# Patient Record
Sex: Female | Born: 1950 | Race: White | Hispanic: No | Marital: Married | Smoking: Never smoker
Health system: Southern US, Community
[De-identification: ages and names within clinical notes are randomized; demographics above are authoritative.]

## PROBLEM LIST (undated history)

## (undated) DIAGNOSIS — Z789 Other specified health status: Secondary | ICD-10-CM

## (undated) DIAGNOSIS — Z1371 Encounter for nonprocreative screening for genetic disease carrier status: Secondary | ICD-10-CM

## (undated) HISTORY — PX: EYE SURGERY: SHX253

## (undated) HISTORY — PX: OTHER SURGICAL HISTORY: SHX169

## (undated) HISTORY — DX: Encounter for nonprocreative screening for genetic disease carrier status: Z13.71

## (undated) HISTORY — PX: APPENDECTOMY: SHX54

---

## 1979-05-18 DIAGNOSIS — O321XX0 Maternal care for breech presentation, not applicable or unspecified: Secondary | ICD-10-CM

## 1989-04-28 HISTORY — PX: TUBAL LIGATION: SHX77

## 2004-07-16 ENCOUNTER — Ambulatory Visit: Payer: Self-pay | Admitting: Unknown Physician Specialty

## 2005-08-13 ENCOUNTER — Ambulatory Visit: Payer: Self-pay | Admitting: Unknown Physician Specialty

## 2006-08-20 ENCOUNTER — Ambulatory Visit: Payer: Self-pay | Admitting: Unknown Physician Specialty

## 2007-08-24 ENCOUNTER — Ambulatory Visit: Payer: Self-pay | Admitting: Unknown Physician Specialty

## 2008-05-24 ENCOUNTER — Ambulatory Visit: Payer: Self-pay | Admitting: Unknown Physician Specialty

## 2008-08-29 ENCOUNTER — Ambulatory Visit: Payer: Self-pay | Admitting: Unknown Physician Specialty

## 2009-03-18 ENCOUNTER — Ambulatory Visit: Payer: Self-pay | Admitting: Specialist

## 2009-04-16 ENCOUNTER — Encounter: Payer: Self-pay | Admitting: Specialist

## 2009-04-28 ENCOUNTER — Encounter: Payer: Self-pay | Admitting: Specialist

## 2009-04-28 HISTORY — PX: CARPAL TUNNEL RELEASE: SHX101

## 2009-05-29 ENCOUNTER — Encounter: Payer: Self-pay | Admitting: Specialist

## 2009-06-26 ENCOUNTER — Encounter: Payer: Self-pay | Admitting: Specialist

## 2009-08-30 ENCOUNTER — Ambulatory Visit: Payer: Self-pay | Admitting: Unknown Physician Specialty

## 2010-02-06 ENCOUNTER — Ambulatory Visit: Payer: Self-pay | Admitting: Specialist

## 2010-11-11 ENCOUNTER — Ambulatory Visit: Payer: Self-pay

## 2012-01-12 ENCOUNTER — Ambulatory Visit: Payer: Self-pay | Admitting: Gastroenterology

## 2012-01-13 LAB — PATHOLOGY REPORT

## 2012-05-26 ENCOUNTER — Ambulatory Visit: Payer: Self-pay | Admitting: Ophthalmology

## 2013-03-29 ENCOUNTER — Ambulatory Visit: Payer: Self-pay | Admitting: Ophthalmology

## 2013-08-06 DIAGNOSIS — N951 Menopausal and female climacteric states: Secondary | ICD-10-CM | POA: Insufficient documentation

## 2013-08-06 DIAGNOSIS — E785 Hyperlipidemia, unspecified: Secondary | ICD-10-CM | POA: Insufficient documentation

## 2013-08-06 DIAGNOSIS — J309 Allergic rhinitis, unspecified: Secondary | ICD-10-CM | POA: Insufficient documentation

## 2014-08-18 NOTE — Op Note (Signed)
PATIENT NAMMarva Panda:  [name], Elizabeth [name] MR#:  [id number] DATE OF BIRTH:  Jul 13, 1950  DATE OF PROCEDURE:  05/26/2012  PROCEDURES PERFORMED:  1.  Pars plana vitrectomy of the left eye.  2.  Internal limiting membrane peel of the left eye.  3.  Gas exchange of the left eye.   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   PRIMARY SURGEON:  Aron BabaMatthew Bruk Tumolo, M.D.  ESTIMATED BLOOD LOSS: Less than 1 mL.   ANESTHESIA: Retrobulbar block of the left eye with monitored anesthesia care.   COMPLICATIONS: None.   INDICATIONS FOR PROCEDURE: This patient presented to my office with decreased vision in the left eye. Examination revealed a full a full thickness macular hole of the left eye. Risks, benefits, and alternatives of the above procedure were discussed and the patient wished to proceed.   DETAILS OF PROCEDURE: After informed consent was obtained, the patient to the operative suite at [facility]. The patient was placed in supine position and was given a small dose of Alfenta and a retrobulbar block was performed on the left eye by the primary surgeon without any complications. The left eye was prepped and draped in sterile manner. After lid speculum was inserted, a 25-gauge trocar was placed inferotemporally through displaced conjunctiva in an oblique fashion 4 mm beyond the limbus. The infusion cannula was turned on and inserted through the trocar and secured into position with Steri-Strips. Two more trocars were placed in a similar fashion superotemporally and superonasally. Vitreous cutter and light pipe were introduced in the eye and a core vitrectomy was performed. Vitreous face was elevated off of the optic nerve using suction. Peripheral vitreous was trimmed for 360 degrees. Indocyanine green was injected into the posterior pole and removed within 30 seconds. Internal limiting membrane  was incised and peeled for 360 degrees around the fovea for a total diameter of 2 disk diameters. Scleral depressed exam was performed for 360 degrees. No signs of any breaks, tears or retinal detachment could be identified. Air-fluid exchange was performed. Four minutes was allowed to elapse for dehydration of the vitreous base. This remnant fluid was removed. 22% SF6 was used as an Nurse, children'sair gas exchange. Trocars were removed and the wounds were noted to be airtight. Pressure in the eye was confirmed to be approximately 15 mmHg. 5 mg of dexamethasone was given into the inferior fornix. The lid speculum was removed. The eye was cleaned and TobraDex was placed in the eye. A patch and shield were placed over the eye. The patient was taken to postanesthesia care with instructions to remain face down.    ____________________________ Ignacia FellingMatthew F. Champ MungoAppenzeller, MD mfa:aw D: 05/26/2012 11:10:07 ET T: 05/26/2012 11:18:27 ET JOB#: [id number]  cc: Ignacia FellingMatthew F. Champ MungoAppenzeller, MD, <Dictator> Cline CoolsMATTHEW F Deloris Moger MD ELECTRONICALLY SIGNED 06/22/2012 6:46

## 2014-08-18 NOTE — Op Note (Signed)
PATIENT NAME:  Elizabeth [name], [name] MR#:  [id number] DATE OF BIRTH:  1951-04-04  DATE OF PROCEDURE:  03/29/2013  PREOPERATIVE DIAGNOSIS: Visually significant cataract of the left eye.   POSTOPERATIVE DIAGNOSIS: Visually significant cataract of the left eye.   OPERATIVE PROCEDURE: Cataract extraction by phacoemulsification with implant of intraocular lens to left eye.   SURGEON: Galen ManilaWilliam Felesha Moncrieffe, MD.   ANESTHESIA:  1. Managed anesthesia care.  2. Topical tetracaine drops followed by 2% Xylocaine jelly applied in the preoperative holding area.   COMPLICATIONS: None.   TECHNIQUE:  Stop and chop.  DESCRIPTION OF PROCEDURE: The patient was examined and consented in the preoperative holding area where the aforementioned topical anesthesia was applied to the left eye and then brought back to the Operating Room where the left eye was prepped and draped in the usual sterile ophthalmic fashion and a lid speculum was placed. A paracentesis was created with the side port blade and the anterior chamber was filled with viscoelastic. A near clear corneal incision was performed with the steel keratome. A continuous curvilinear capsulorrhexis was performed with a cystotome followed by the capsulorrhexis forceps. Hydrodissection and hydrodelineation were carried out with BSS on a blunt cannula. The lens was removed in a stop and chop technique and the remaining cortical material was removed with the irrigation-aspiration handpiece. The capsular bag was inflated with viscoelastic and the Tecnis ZCB00 21.5-diopter lens, serial number [id number] was placed in the capsular bag without complication. The remaining viscoelastic was removed from the eye with the irrigation-aspiration handpiece. The wounds were hydrated. The anterior chamber was flushed with Miostat and the eye was inflated to physiologic pressure. 0.1 mL of cefuroxime concentration 10 mg/mL was placed in the anterior chamber. The wounds were found to be water  tight. The eye was dressed with Vigamox. The patient was given protective glasses to wear throughout the day and a shield with which to sleep tonight. The patient was also given drops with which to begin a drop regimen today and will follow-up with me in one day.     ____________________________ Jerilee FieldWilliam L. Westly Hinnant, MD wlp:dmm D: 03/29/2013 21:28:00 ET T: 03/29/2013 21:40:45 ET JOB#: [id number]  cc: Giancarlo Askren L. Florentine Diekman, MD, <Dictator> Jerilee FieldWILLIAM L Dawt Reeb MD ELECTRONICALLY SIGNED 03/31/2013 9:56

## 2014-11-30 LAB — HM MAMMOGRAPHY
HM MAMMO: NORMAL (ref 0–4)
HM Mammogram: NORMAL (ref 0–4)

## 2014-11-30 LAB — HM PAP SMEAR: HM Pap smear: NEGATIVE

## 2015-04-29 DIAGNOSIS — Z1371 Encounter for nonprocreative screening for genetic disease carrier status: Secondary | ICD-10-CM

## 2015-04-29 HISTORY — DX: Encounter for nonprocreative screening for genetic disease carrier status: Z13.71

## 2016-09-17 DIAGNOSIS — H2511 Age-related nuclear cataract, right eye: Secondary | ICD-10-CM | POA: Diagnosis not present

## 2016-11-05 DIAGNOSIS — H2511 Age-related nuclear cataract, right eye: Secondary | ICD-10-CM | POA: Diagnosis not present

## 2016-11-06 ENCOUNTER — Encounter: Payer: Self-pay | Admitting: *Deleted

## 2016-11-07 LAB — HM MAMMOGRAPHY: HM Mammogram: NORMAL (ref 0–4)

## 2016-11-10 ENCOUNTER — Telehealth: Payer: Self-pay | Admitting: Obstetrics and Gynecology

## 2016-11-10 NOTE — Telephone Encounter (Signed)
AMS pt. 

## 2016-11-10 NOTE — Telephone Encounter (Signed)
AMS it looks like it was refilled by an outside provider on 7/12. Please look behind me and advise. Thanks

## 2016-11-10 NOTE — Telephone Encounter (Signed)
Pt is scheduled for her annual on 01/21/17 but is about to run out of her progesterone testosterone cream and would like a refill sent to [facility]. Please advise. Thanks TNP

## 2016-11-12 ENCOUNTER — Other Ambulatory Visit: Payer: Self-pay | Admitting: Obstetrics and Gynecology

## 2016-11-12 MED ORDER — UNABLE TO FIND: 0.5000 mL | Freq: Every day | 6 refills | Status: DC

## 2016-11-12 NOTE — Telephone Encounter (Signed)
Left her a voicemail that it was sent this morning (that is the RX I could escribe)

## 2016-11-12 NOTE — Telephone Encounter (Signed)
Patient calling about Rx this morning, has checked with [facility] and they haven't received anything.  She says this is a compound med and her insurance doesn't cover and she pays out of pocket.  Pt is about to run out, appt in September.

## 2016-11-18 ENCOUNTER — Ambulatory Visit
Admission: RE | Admit: 2016-11-18 | Discharge: 2016-11-18 | Disposition: A | Discharge status: Home / Self Care | Payer: BLUE CROSS/BLUE SHIELD | Source: Ambulatory Visit | Attending: Ophthalmology | Admitting: Ophthalmology

## 2016-11-18 ENCOUNTER — Encounter
Admission: RE | Disposition: A | Discharge status: Home / Self Care | Payer: Self-pay | Source: Ambulatory Visit | Attending: Ophthalmology

## 2016-11-18 ENCOUNTER — Ambulatory Visit: Payer: BLUE CROSS/BLUE SHIELD | Admitting: Anesthesiology

## 2016-11-18 ENCOUNTER — Encounter: Payer: Self-pay | Admitting: *Deleted

## 2016-11-18 DIAGNOSIS — Z79899 Other long term (current) drug therapy: Secondary | ICD-10-CM | POA: Insufficient documentation

## 2016-11-18 DIAGNOSIS — Z7952 Long term (current) use of systemic steroids: Secondary | ICD-10-CM | POA: Diagnosis not present

## 2016-11-18 DIAGNOSIS — H2511 Age-related nuclear cataract, right eye: Secondary | ICD-10-CM | POA: Insufficient documentation

## 2016-11-18 HISTORY — PX: CATARACT EXTRACTION W/PHACO: SHX586

## 2016-11-18 HISTORY — DX: Other specified health status: Z78.9

## 2016-11-18 SURGERY — PHACOEMULSIFICATION, CATARACT, WITH IOL INSERTION
Anesthesia: Monitor Anesthesia Care | Site: Eye | Laterality: Right | Wound class: Clean

## 2016-11-18 MED ORDER — NA CHONDROIT SULF-NA HYALURON 40-17 MG/ML IO SOLN
INTRAOCULAR | Status: DC | PRN
  Administered 2016-11-18: 1 mL via INTRAOCULAR

## 2016-11-18 MED ORDER — MIDAZOLAM HCL 2 MG/2ML IJ SOLN
INTRAMUSCULAR | Status: AC
  Filled 2016-11-18: qty 2

## 2016-11-18 MED ORDER — FENTANYL CITRATE (PF) 100 MCG/2ML IJ SOLN
INTRAMUSCULAR | Status: AC
  Filled 2016-11-18: qty 2

## 2016-11-18 MED ORDER — EPINEPHRINE PF 1 MG/ML IJ SOLN
INTRAOCULAR | Status: DC | PRN
  Administered 2016-11-18: 10:00:00 via OPHTHALMIC

## 2016-11-18 MED ORDER — MOXIFLOXACIN HCL 0.5 % OP SOLN: 1.0000 [drp] | OPHTHALMIC | Status: DC | PRN

## 2016-11-18 MED ORDER — SODIUM CHLORIDE 0.9 % IV SOLN
INTRAVENOUS | Status: DC
  Administered 2016-11-18: 09:00:00 via INTRAVENOUS

## 2016-11-18 MED ORDER — MOXIFLOXACIN HCL 0.5 % OP SOLN
OPHTHALMIC | Status: DC | PRN
  Administered 2016-11-18: 0.2 mL via OPHTHALMIC

## 2016-11-18 MED ORDER — MIDAZOLAM HCL 2 MG/2ML IJ SOLN
INTRAMUSCULAR | Status: DC | PRN
  Administered 2016-11-18: 1 mg via INTRAVENOUS

## 2016-11-18 MED ORDER — FENTANYL CITRATE (PF) 100 MCG/2ML IJ SOLN
INTRAMUSCULAR | Status: DC | PRN
  Administered 2016-11-18: 50 ug via INTRAVENOUS

## 2016-11-18 MED ORDER — POVIDONE-IODINE 5 % OP SOLN
OPHTHALMIC | Status: DC | PRN
  Administered 2016-11-18: 1 via OPHTHALMIC

## 2016-11-18 MED ORDER — LIDOCAINE HCL (PF) 4 % IJ SOLN
INTRAOCULAR | Status: DC | PRN
  Administered 2016-11-18: 4 mL via OPHTHALMIC

## 2016-11-18 MED ORDER — CARBACHOL 0.01 % IO SOLN
INTRAOCULAR | Status: DC | PRN
  Administered 2016-11-18: 0.5 mL via INTRAOCULAR

## 2016-11-18 MED ORDER — ARMC OPHTHALMIC DILATING DROPS
1.0000 "application " | OPHTHALMIC | Status: DC | PRN
  Administered 2016-11-18 (×3): 1 via OPHTHALMIC

## 2016-11-18 SURGICAL SUPPLY — 16 items

## 2016-11-18 NOTE — H&P (Signed)
All labs reviewed. Abnormal studies sent to patients PCP when indicated.  Previous H&P reviewed, patient examined, there are NO CHANGES.  Elizabeth [name] LOUIS7/24/20189:44 AM

## 2016-11-18 NOTE — Anesthesia Postprocedure Evaluation (Signed)
Anesthesia Post Note  Patient: Elizabeth [name]  Procedure(s) Performed: Procedure(s) (LRB): CATARACT EXTRACTION PHACO AND INTRAOCULAR LENS PLACEMENT (IOC) (Right)  Patient location during evaluation: PACU Anesthesia Type: MAC Level of consciousness: awake and alert Pain management: pain level controlled Vital Signs Assessment: post-procedure vital signs reviewed and stable Respiratory status: spontaneous breathing, nonlabored ventilation, respiratory function stable and patient connected to nasal cannula oxygen Cardiovascular status: stable and blood pressure returned to baseline Anesthetic complications: no     Last Vitals:  Vitals:   11/18/16 0840 11/18/16 1013  BP: (!) 101/53 (!) 94/40  Pulse: 63 (!) 52  Resp: 16 16  Temp: 36.6 C     Last Pain:  Vitals:   11/18/16 1013  TempSrc: Temporal                 Darlyne Russian

## 2016-11-18 NOTE — Anesthesia Post-op Follow-up Note (Cosign Needed)
Anesthesia QCDR form completed.        

## 2016-11-18 NOTE — Op Note (Signed)
.   PREOPERATIVE DIAGNOSIS:  Nuclear sclerotic cataract of the right eye.   POSTOPERATIVE DIAGNOSIS:  NUCLEAR SCLEROTIC CATARACT RIGHT EYE   OPERATIVE PROCEDURE: Procedure(s): CATARACT EXTRACTION PHACO AND INTRAOCULAR LENS PLACEMENT (IOC)   SURGEON:  Galen ManilaWilliam Martika Egler, MD.   ANESTHESIA:  Anesthesiologist: Alver FisherPenwarden, Amy, MD CRNA: Marlana SalvageJessup, Sandra, CRNA  1.      Managed anesthesia care. 2.      0.591ml of Shugarcaine was instilled in the eye following the paracentesis.   COMPLICATIONS:  None.   TECHNIQUE:   Stop and chop   DESCRIPTION OF PROCEDURE:  The patient was examined and consented in the preoperative holding area where the aforementioned topical anesthesia was applied to the right eye and then brought back to the Operating Room where the right eye was prepped and draped in the usual sterile ophthalmic fashion and a lid speculum was placed. A paracentesis was created with the side port blade and the anterior chamber was filled with viscoelastic. A near clear corneal incision was performed with the steel keratome. A continuous curvilinear capsulorrhexis was performed with a cystotome followed by the capsulorrhexis forceps. Hydrodissection and hydrodelineation were carried out with BSS on a blunt cannula. The lens was removed in a stop and chop  technique and the remaining cortical material was removed with the irrigation-aspiration handpiece. The capsular bag was inflated with viscoelastic and the Technis ZCB00  lens was placed in the capsular bag without complication. The remaining viscoelastic was removed from the eye with the irrigation-aspiration handpiece. The wounds were hydrated. The anterior chamber was flushed with Miostat and the eye was inflated to physiologic pressure. 0.71ml of Vigamox was placed in the anterior chamber. The wounds were found to be water tight. The eye was dressed with Vigamox. The patient was given protective glasses to wear throughout the day and a shield with which  to sleep tonight. The patient was also given drops with which to begin a drop regimen today and will follow-up with me in one day.  Implant Name Type Inv. Item Serial No. Manufacturer Lot No. LRB No. Used  LENS IOL DIOP 21.5 - [id number] Intraocular Lens LENS IOL DIOP 21.5 [id number]   Right 1   Procedure(s) with comments: CATARACT EXTRACTION PHACO AND INTRAOCULAR LENS PLACEMENT (IOC) (Right) - US 00:30.3 AP% 19.8 CDE 6.01 Fluid Pack lot # [id number]  Electronically signed: Moiz Ryant LOUIS 11/18/2016 10:11 AM

## 2016-11-18 NOTE — Anesthesia Preprocedure Evaluation (Signed)
Anesthesia Evaluation  Patient identified by MRN, date of birth, ID band Patient awake    Reviewed: Allergy & Precautions, NPO status , Patient's Chart, lab work & pertinent test results  History of Anesthesia Complications Negative for: history of anesthetic complications  Airway Mallampati: II  TM Distance: >3 FB Neck ROM: Full    Dental  (+) Implants, Caps   Pulmonary neg pulmonary ROS, neg sleep apnea, neg COPD,    breath sounds clear to auscultation- rhonchi (-) wheezing      Cardiovascular Exercise Tolerance: Good (-) hypertension(-) CAD, (-) Past MI and (-) Cardiac Stents  Rhythm:Regular Rate:Normal - Systolic murmurs and - Diastolic murmurs    Neuro/Psych negative neurological ROS  negative psych ROS   GI/Hepatic negative GI ROS, Neg liver ROS,   Endo/Other  negative endocrine ROSneg diabetes  Renal/GU negative Renal ROS     Musculoskeletal negative musculoskeletal ROS (+)   Abdominal (+) - obese,   Peds  Hematology negative hematology ROS (+)   Anesthesia Other Findings    Reproductive/Obstetrics                             Anesthesia Physical Anesthesia Plan  ASA: I  Anesthesia Plan: MAC   Post-op Pain Management:    Induction: Intravenous  PONV Risk Score and Plan: 2 and Midazolam  Airway Management Planned: Natural Airway  Additional Equipment:   Intra-op Plan:   Post-operative Plan:   Informed Consent: I have reviewed the patients History and Physical, chart, labs and discussed the procedure including the risks, benefits and alternatives for the proposed anesthesia with the patient or authorized representative who has indicated his/her understanding and acceptance.     Plan Discussed with: CRNA and Anesthesiologist  Anesthesia Plan Comments:         Anesthesia Quick Evaluation

## 2016-11-18 NOTE — Discharge Instructions (Signed)
Eye Surgery Discharge Instructions  Expect mild scratchy sensation or mild soreness. DO NOT RUB YOUR EYE!  The day of surgery:  Minimal physical activity, but bed rest is not required  No reading, computer work, or close hand work  No bending, lifting, or straining.  May watch TV  For 24 hours:  No driving, legal decisions, or alcoholic beverages  Safety precautions  Eat anything you prefer: It is better to start with liquids, then soup then solid foods.  _____ Eye patch should be worn until postoperative exam tomorrow.  ____ Solar shield eyeglasses should be worn for comfort in the sunlight/patch while sleeping  Resume all regular medications including aspirin or Coumadin if these were discontinued prior to surgery. You may shower, bathe, shave, or wash your hair. Tylenol may be taken for mild discomfort.  Call your doctor if you experience significant pain, nausea, or vomiting, fever > 101 or other signs of infection. [phone] or [phone] Specific instructions:  Follow-up Information    Galen ManilaPorfilio, William, MD Follow up.   Specialty:  Ophthalmology Why:  July 25 at 8:15am Contact information: [address] [place] [address] [phone]

## 2016-11-18 NOTE — Anesthesia Procedure Notes (Signed)
Procedure Name: MAC Date/Time: 11/18/2016 9:50 AM Performed by: Darlyne Russian Pre-anesthesia Checklist: Patient identified, Emergency Drugs available, Suction available, Patient being monitored and Timeout performed Oxygen Delivery Method: Nasal cannula Placement Confirmation: positive ETCO2

## 2016-11-18 NOTE — Transfer of Care (Signed)
Immediate Anesthesia Transfer of Care Note  Patient: Elizabeth [name]  Procedure(s) Performed: Procedure(s) with comments: CATARACT EXTRACTION PHACO AND INTRAOCULAR LENS PLACEMENT (IOC) (Right) - [place] 00:30.3 AP% 19.8 CDE 6.01 Fluid Pack lot # [id number]  Patient Location: PACU  Anesthesia Type:MAC  Level of Consciousness: awake, alert  and oriented  Airway & Oxygen Therapy: Patient Spontanous Breathing  Post-op Assessment: Report given to RN and Post -op Vital signs reviewed and stable  Post vital signs: Reviewed and stable  Last Vitals:  Vitals:   11/18/16 0840 11/18/16 1013  BP: (!) 101/53 (!) 94/40  Pulse: 63 (!) 52  Resp: 16 16  Temp: 36.6 C     Last Pain:  Vitals:   11/18/16 1013  TempSrc: Temporal      Patients Stated Pain Goal: 0 (03/55/97 4163)  Complications: No apparent anesthesia complications

## 2016-12-06 DIAGNOSIS — M9905 Segmental and somatic dysfunction of pelvic region: Secondary | ICD-10-CM | POA: Diagnosis not present

## 2016-12-06 DIAGNOSIS — M5416 Radiculopathy, lumbar region: Secondary | ICD-10-CM | POA: Diagnosis not present

## 2016-12-06 DIAGNOSIS — M5431 Sciatica, right side: Secondary | ICD-10-CM | POA: Diagnosis not present

## 2016-12-06 DIAGNOSIS — M9903 Segmental and somatic dysfunction of lumbar region: Secondary | ICD-10-CM | POA: Diagnosis not present

## 2016-12-08 DIAGNOSIS — M9903 Segmental and somatic dysfunction of lumbar region: Secondary | ICD-10-CM | POA: Diagnosis not present

## 2016-12-08 DIAGNOSIS — M5416 Radiculopathy, lumbar region: Secondary | ICD-10-CM | POA: Diagnosis not present

## 2016-12-08 DIAGNOSIS — M5431 Sciatica, right side: Secondary | ICD-10-CM | POA: Diagnosis not present

## 2016-12-08 DIAGNOSIS — M9905 Segmental and somatic dysfunction of pelvic region: Secondary | ICD-10-CM | POA: Diagnosis not present

## 2016-12-17 DIAGNOSIS — M5431 Sciatica, right side: Secondary | ICD-10-CM | POA: Diagnosis not present

## 2016-12-17 DIAGNOSIS — M9903 Segmental and somatic dysfunction of lumbar region: Secondary | ICD-10-CM | POA: Diagnosis not present

## 2016-12-17 DIAGNOSIS — M9905 Segmental and somatic dysfunction of pelvic region: Secondary | ICD-10-CM | POA: Diagnosis not present

## 2016-12-17 DIAGNOSIS — M5416 Radiculopathy, lumbar region: Secondary | ICD-10-CM | POA: Diagnosis not present

## 2016-12-20 DIAGNOSIS — M5431 Sciatica, right side: Secondary | ICD-10-CM | POA: Diagnosis not present

## 2016-12-20 DIAGNOSIS — M5416 Radiculopathy, lumbar region: Secondary | ICD-10-CM | POA: Diagnosis not present

## 2016-12-20 DIAGNOSIS — M9903 Segmental and somatic dysfunction of lumbar region: Secondary | ICD-10-CM | POA: Diagnosis not present

## 2016-12-20 DIAGNOSIS — M9905 Segmental and somatic dysfunction of pelvic region: Secondary | ICD-10-CM | POA: Diagnosis not present

## 2016-12-22 DIAGNOSIS — M5416 Radiculopathy, lumbar region: Secondary | ICD-10-CM | POA: Diagnosis not present

## 2016-12-22 DIAGNOSIS — M9905 Segmental and somatic dysfunction of pelvic region: Secondary | ICD-10-CM | POA: Diagnosis not present

## 2016-12-22 DIAGNOSIS — M5431 Sciatica, right side: Secondary | ICD-10-CM | POA: Diagnosis not present

## 2016-12-22 DIAGNOSIS — M9903 Segmental and somatic dysfunction of lumbar region: Secondary | ICD-10-CM | POA: Diagnosis not present

## 2016-12-24 DIAGNOSIS — M5431 Sciatica, right side: Secondary | ICD-10-CM | POA: Diagnosis not present

## 2016-12-24 DIAGNOSIS — M9903 Segmental and somatic dysfunction of lumbar region: Secondary | ICD-10-CM | POA: Diagnosis not present

## 2016-12-24 DIAGNOSIS — M5416 Radiculopathy, lumbar region: Secondary | ICD-10-CM | POA: Diagnosis not present

## 2016-12-24 DIAGNOSIS — M9905 Segmental and somatic dysfunction of pelvic region: Secondary | ICD-10-CM | POA: Diagnosis not present

## 2017-01-21 ENCOUNTER — Encounter: Payer: Self-pay | Admitting: Obstetrics and Gynecology

## 2017-01-21 ENCOUNTER — Ambulatory Visit (INDEPENDENT_AMBULATORY_CARE_PROVIDER_SITE_OTHER): Payer: BLUE CROSS/BLUE SHIELD | Admitting: Obstetrics and Gynecology

## 2017-01-21 VITALS — BP 100/66 | HR 74 | Ht 63.0 in | Wt 117.0 lb

## 2017-01-21 DIAGNOSIS — Z01419 Encounter for gynecological examination (general) (routine) without abnormal findings: Secondary | ICD-10-CM | POA: Diagnosis not present

## 2017-01-21 DIAGNOSIS — Z124 Encounter for screening for malignant neoplasm of cervix: Secondary | ICD-10-CM | POA: Diagnosis not present

## 2017-01-21 DIAGNOSIS — Z9189 Other specified personal risk factors, not elsewhere classified: Secondary | ICD-10-CM

## 2017-01-21 DIAGNOSIS — E559 Vitamin D deficiency, unspecified: Secondary | ICD-10-CM

## 2017-01-21 DIAGNOSIS — Z1239 Encounter for other screening for malignant neoplasm of breast: Secondary | ICD-10-CM

## 2017-01-21 DIAGNOSIS — Z13220 Encounter for screening for lipoid disorders: Secondary | ICD-10-CM

## 2017-01-21 DIAGNOSIS — Z13820 Encounter for screening for osteoporosis: Secondary | ICD-10-CM | POA: Diagnosis not present

## 2017-01-21 DIAGNOSIS — M810 Age-related osteoporosis without current pathological fracture: Secondary | ICD-10-CM | POA: Diagnosis not present

## 2017-01-21 DIAGNOSIS — Z780 Asymptomatic menopausal state: Secondary | ICD-10-CM

## 2017-01-21 DIAGNOSIS — Z131 Encounter for screening for diabetes mellitus: Secondary | ICD-10-CM | POA: Diagnosis not present

## 2017-01-21 DIAGNOSIS — Z1231 Encounter for screening mammogram for malignant neoplasm of breast: Secondary | ICD-10-CM | POA: Diagnosis not present

## 2017-01-21 MED ORDER — AMBULATORY NON FORMULARY MEDICATION: 0 refills | Status: DC

## 2017-01-21 MED ORDER — ESTRADIOL 0.025 MG/24HR TD PTWK: 0.0250 mg | MEDICATED_PATCH | TRANSDERMAL | 11 refills | Status: DC

## 2017-01-21 MED ORDER — ESTRADIOL 0.025 MG/24HR TD PTWK: 0.0250 mg | MEDICATED_PATCH | TRANSDERMAL | 3 refills | Status: DC

## 2017-01-21 NOTE — Progress Notes (Signed)
Patient ID: Elizabeth [name], female   DOB: [birth year], 66 y.o.   MRN: [id number]     Gynecology Annual Exam  PCP: Kirk Ruths, MD  Chief Complaint:  Chief Complaint  Patient presents with  . Gynecologic Exam    History of Present Illness:Patient is a 66 y.o. [id number] presents for annual exam. The patient has no complaints today.   LMP: No LMP recorded. Patient is postmenopausal.  The patient is sexually active. She denies dyspareunia.  The patient does perform self breast exams.  There is no notable family history of breast or ovarian cancer in her family.  The patient wears seatbelts: yes.   The patient has regular exercise: not asked.    The patient denies current symptoms of depression.     Review of Systems: ROS  Past Medical History:  Past Medical History:  Diagnosis Date  . BRCA gene mutation negative 2017  . Medical history non-contributory     Past Surgical History:  Past Surgical History:  Procedure Laterality Date  . APPENDECTOMY    . CARPAL TUNNEL RELEASE  2011  . CATARACT EXTRACTION W/PHACO Right 11/18/2016   Procedure: CATARACT EXTRACTION PHACO AND INTRAOCULAR LENS PLACEMENT (IOC);  Surgeon: Birder Robson, MD;  Location: [facility] ORS;  Service: Ophthalmology;  Laterality: Right;  [place] 00:30.3 AP% 19.8 CDE 6.01 Fluid Pack lot # [id number]  . [place]  . ctr    . EYE SURGERY    . TUBAL LIGATION  1991    Gynecologic History:  No LMP recorded. Patient is postmenopausal. Last Pap: Results were: 2016  no abnormalities  Last mammogram: 2016 Results were: BI-RAD I Obstetric History: [id number]  Family History:  Family History  Problem Relation Age of Onset  . Heart failure Maternal Grandfather   . Heart failure Paternal Grandmother   . Breast cancer Paternal Aunt 60    Social History:  Social History   Social History  . Marital status: Married    Spouse name: N/A  . Number of children: N/A  . Years of education: N/A    Occupational History  . Not on file.   Social History Main Topics  . Smoking status: Never Smoker  . Smokeless tobacco: Current User  . Alcohol use No  . Drug use: No  . Sexual activity: Yes    Partners: Male    Birth control/ protection: Post-menopausal   Other Topics Concern  . Not on file   Social History Narrative  . No narrative on file    Allergies:  No Known Allergies  Medications: Prior to Admission medications   Medication Sig Start Date End Date Taking? Authorizing Provider  ESTROGENS CONJUGATED PO Take 0.25 mg by mouth once a week.   Yes [provider]  Fluticasone Propionate (FLONASE NA) Place into the nose.   Yes [provider]  Lifitegrast Shirley Friar OP) Apply to eye.   Yes [provider]  UNABLE TO FIND Apply 0.5 mLs topically daily. PG 40 ml/ml plus TESTOSTERONE 1/mg/ml    1/2 ml daily 11/12/16  Yes Malachy Mood, MD  XIIDRA 5 % SOLN  11/24/16  Yes [provider]    Physical Exam Vitals: Blood pressure 100/66, pulse 74, height 5' 3"  (1.6 m), weight 117 lb (53.1 kg).  General: NAD HEENT: normocephalic, anicteric Thyroid: no enlargement, no palpable nodules Pulmonary: No increased work of breathing, CTAB Cardiovascular: RRR, distal pulses 2+ Breast: Breast symmetrical, no tenderness, no palpable nodules or masses, no skin or  nipple retraction present, no nipple discharge.  No axillary or supraclavicular lymphadenopathy. Abdomen: NABS, soft, non-tender, non-distended.  Umbilicus without lesions.  No hepatomegaly, splenomegaly or masses palpable. No evidence of hernia  Genitourinary:  External: Normal external female genitalia.  Normal urethral meatus, normal  Bartholin's and Skene's glands.    Vagina: Normal vaginal mucosa, no evidence of prolapse.    Cervix: Grossly normal in appearance, no bleeding  Uterus: Non-enlarged, mobile, normal contour.  No CMT  Adnexa: ovaries non-enlarged, no adnexal masses  Rectal:  deferred  Lymphatic: no evidence of inguinal lymphadenopathy Extremities: no edema, erythema, or tenderness Neurologic: Grossly intact Psychiatric: mood appropriate, affect full  Female chaperone present for pelvic and breast  portions of the physical exam     Assessment: 66 y.o. [id number] routine annual exam  Plan: Problem List Items Addressed This Visit    None    Visit Diagnoses    Osteoporosis screening    -  Primary   Relevant Orders   CMP14+LP+TP+TSH+CBC/Plt   DG Bone Density   Screening for malignant neoplasm of cervix       Screening for diabetes mellitus       Relevant Orders   CMP14+LP+TP+TSH+CBC/Plt   Screening for lipoid disorders       Relevant Orders   CMP14+LP+TP+TSH+CBC/Plt   Breast screening       Relevant Orders   MM DIGITAL SCREENING BILATERAL   Encounter for gynecological examination without abnormal finding       Relevant Orders   CMP14+LP+TP+TSH+CBC/Plt   DG Bone Density   Vitamin D deficiency       High risk for hip fracture       Relevant Orders   DG Bone Density   Osteopenia after menopause       Relevant Orders   DG Bone Density      1) Mammogram - recommend yearly screening mammogram.  Mammogram Was ordered today  2) STI screening was not offered  3) ASCCP guidelines and rational discussed.  Patient opts for discontinue screening interval  4) Osteoporosis  - per USPTF routine screening DEXA at age 75 - FRAX 66 year [name] fracture risk 19%, 10 year hip fracture risk 5.0 taking account prior fracture  Consider FDA-approved medical therapies in postmenopausal women and men aged 66 years and older, based on the following: a) A hip or vertebral (clinical or morphometric) fracture b) T-score ? -2.5 at the femoral neck or spine after appropriate evaluation to exclude secondary causes C) Low bone mass (T-score between -1.0 and -2.5 at the femoral neck or spine) and a 10-year probability of a hip fracture ? 3% or a 10-year probability of a  [name] osteoporosis-related fracture ? 20% based on the [place]-adapted WHO algorithm  We discussed WHI study findings in detail.  In the combined estrogen-progesterone arm breast cancer risk was increased by 1.26 (CI of 1.00 to 1.59), coronary heart disease 1.29 (CI 1.02-1.63), stroke risk 1.41 (1.07-1.85), and pulmonary embolism 2.13 (CI 1.39-3.25).  That being said the while statistically significant the actual number of cases attributable are relatively small at an addition 8 cases of breast cancer, 7 more coronary artery event, 8 more strokes, and 8 additional case of pulmonary embolism per 10,000 women.  Study was terminated because of the increased breast cancer risk, this was not seen in the progestin only arm of the study for women without an intact uterus.  In addition it is important to note that HRT also had positive or risk reducing  effects, and all cause mortality between the HRT/non-HRT users is not statistically different.  Estrogen-progestin HRT decreased the relative risk of hip fracture 0.66 (CI 0.45-0.98), colorectal cancer 0.63 (0.43-0.92).  Current consensus is to limit dose to the lowest effective dose, and shortest treatment duration possible.  Breast cancer risk appeared to increase after 4 years of use.  Also important to note is that these risk refer to systemic HRT for the treatment of vasomotor symptoms, and do not apply to vaginal preperations with minimal systemic absorption and aimed at treating symptoms of vulvovaginal atrophy.    We briefly touched on findings of WHIMS trial published in 2005 which looked at women 26 year of age or older, and whether HRT was protective against the development of dementia.  The study revealed that HRT actually increased the risk for the development of dementia but was limited by looking only at patients 58 years of age and older.  The subsequent KEEPS trial  In 2012 which looked at HRT in recently postmenopausal women did not show any improvement in  cognitive function for women on HRT.  However, there was also no significant cognitive declines seen in recently postmenopausal women receiving HRT as had previously been shown in the [facility] trial.    5) Routine healthcare maintenance including cholesterol, diabetes screening discussed managed by PCP  6) Colonoscopy - up to date managed by PCP  7) Follow up 1 year for routine annual

## 2017-01-21 NOTE — Patient Instructions (Addendum)
-   FRAX 10 year major fracture risk 19%, 10 year hip fracture risk 5.0 taking account prior fracture  We discussed WHI study findings in detail.  In the combined estrogen-progesterone arm breast cancer risk was increased by 1.26 (CI of 1.00 to 1.59), coronary heart disease 1.29 (CI 1.02-1.63), stroke risk 1.41 (1.07-1.85), and pulmonary embolism 2.13 (CI 1.39-3.25).  That being said the while statistically significant the actual number of cases attributable are relatively small at an addition 8 cases of breast cancer, 7 more coronary artery event, 8 more strokes, and 8 additional case of pulmonary embolism per 10,000 women.  Study was terminated because of the increased breast cancer risk, this was not seen in the progestin only arm of the study for women without an intact uterus.  In addition it is important to note that HRT also had positive or risk reducing effects, and all cause mortality between the HRT/non-HRT users is not statistically different.  Estrogen-progestin HRT decreased the relative risk of hip fracture 0.66 (CI 0.45-0.98), colorectal cancer 0.63 (0.43-0.92).  Current consensus is to limit dose to the lowest effective dose, and shortest treatment duration possible.  Breast cancer risk appeared to increase after 4 years of use.  Also important to note is that these risk refer to systemic HRT for the treatment of vasomotor symptoms, and do not apply to vaginal preperations with minimal systemic absorption and aimed at treating symptoms of vulvovaginal atrophy.    We briefly touched on findings of WHIMS trial published in 2005 which looked at women 44 year of age or older, and whether HRT was protective against the development of dementia.  The study revealed that HRT actually increased the risk for the development of dementia but was limited by looking only at patients 76 years of age and older.  The subsequent KEEPS trial  In 2012 which looked at HRT in recently postmenopausal women did not show  any improvement in cognitive function for women on HRT.  However, there was also no significant cognitive declines seen in recently postmenopausal women receiving HRT as had previously been shown in the [facility] trial.

## 2017-01-22 LAB — CMP14+LP+TP+TSH+CBC/PLT
A/G RATIO: 2.9 — AB (ref 1.2–2.2)
ALT: 10 IU/L (ref 0–32)
AST: 18 IU/L (ref 0–40)
Albumin: 4.4 g/dL (ref 3.6–4.8)
Alkaline Phosphatase: 64 IU/L (ref 39–117)
BILIRUBIN TOTAL: 0.4 mg/dL (ref 0.0–1.2)
BUN/Creatinine Ratio: 19 (ref 12–28)
BUN: 16 mg/dL (ref 8–27)
CALCIUM: 9.5 mg/dL (ref 8.7–10.3)
CO2: 26 mmol/L (ref 20–29)
Chloride: 106 mmol/L (ref 96–106)
Cholesterol, Total: 219 mg/dL — ABNORMAL HIGH (ref 100–199)
Creatinine, Ser: 0.86 mg/dL (ref 0.57–1.00)
Free Thyroxine Index: 1.7 (ref 1.2–4.9)
GFR calc Af Amer: 82 mL/min/{1.73_m2} (ref >59)
GFR, EST NON AFRICAN AMERICAN: 71 mL/min/{1.73_m2} (ref >59)
GLUCOSE: 97 mg/dL (ref 65–99)
Globulin, Total: 1.5 g/dL (ref 1.5–4.5)
HDL: 96 mg/dL (ref >39)
Hematocrit: 41 % (ref 34.0–46.6)
Hemoglobin: 13.8 g/dL (ref 11.1–15.9)
LDL Calculated: 114 mg/dL — ABNORMAL HIGH (ref 0–99)
LDl/HDL Ratio: 1.2 ratio (ref 0.0–3.2)
MCH: 29.9 pg (ref 26.6–33.0)
MCHC: 33.7 g/dL (ref 31.5–35.7)
MCV: 89 fL (ref 79–97)
PLATELETS: 200 10*3/uL (ref 150–379)
Potassium: 4.7 mmol/L (ref 3.5–5.2)
RBC: 4.62 x10E6/uL (ref 3.77–5.28)
RDW: 13.3 % (ref 12.3–15.4)
Sodium: 145 mmol/L — ABNORMAL HIGH (ref 134–144)
T3 UPTAKE RATIO: 25 % (ref 24–39)
T4, Total: 6.9 ug/dL (ref 4.5–12.0)
TRIGLYCERIDES: 46 mg/dL (ref 0–149)
TSH: 2.84 u[IU]/mL (ref 0.450–4.500)
Total Protein: 5.9 g/dL — ABNORMAL LOW (ref 6.0–8.5)
VLDL Cholesterol Cal: 9 mg/dL (ref 5–40)
WBC: 4.1 10*3/uL (ref 3.4–10.8)

## 2017-03-03 DIAGNOSIS — J3489 Other specified disorders of nose and nasal sinuses: Secondary | ICD-10-CM | POA: Diagnosis not present

## 2017-03-03 DIAGNOSIS — J029 Acute pharyngitis, unspecified: Secondary | ICD-10-CM | POA: Diagnosis not present

## 2017-03-03 DIAGNOSIS — R0981 Nasal congestion: Secondary | ICD-10-CM | POA: Diagnosis not present

## 2017-03-21 DIAGNOSIS — R05 Cough: Secondary | ICD-10-CM | POA: Diagnosis not present

## 2017-03-21 DIAGNOSIS — J069 Acute upper respiratory infection, unspecified: Secondary | ICD-10-CM | POA: Diagnosis not present

## 2017-03-21 DIAGNOSIS — J019 Acute sinusitis, unspecified: Secondary | ICD-10-CM | POA: Diagnosis not present

## 2017-05-19 ENCOUNTER — Ambulatory Visit
Admission: RE | Admit: 2017-05-19 | Discharge: 2017-05-19 | Disposition: A | Discharge status: Home / Self Care | Payer: BLUE CROSS/BLUE SHIELD | Source: Ambulatory Visit | Attending: Obstetrics and Gynecology | Admitting: Obstetrics and Gynecology

## 2017-05-19 DIAGNOSIS — Z1231 Encounter for screening mammogram for malignant neoplasm of breast: Secondary | ICD-10-CM | POA: Insufficient documentation

## 2017-05-19 DIAGNOSIS — Z780 Asymptomatic menopausal state: Secondary | ICD-10-CM | POA: Diagnosis not present

## 2017-05-19 DIAGNOSIS — Z01419 Encounter for gynecological examination (general) (routine) without abnormal findings: Secondary | ICD-10-CM | POA: Diagnosis not present

## 2017-05-19 DIAGNOSIS — Z13820 Encounter for screening for osteoporosis: Secondary | ICD-10-CM | POA: Diagnosis not present

## 2017-05-19 DIAGNOSIS — Z9189 Other specified personal risk factors, not elsewhere classified: Secondary | ICD-10-CM | POA: Insufficient documentation

## 2017-05-19 DIAGNOSIS — Z1239 Encounter for other screening for malignant neoplasm of breast: Secondary | ICD-10-CM

## 2017-05-19 DIAGNOSIS — M810 Age-related osteoporosis without current pathological fracture: Secondary | ICD-10-CM | POA: Diagnosis not present

## 2017-05-19 DIAGNOSIS — M85852 Other specified disorders of bone density and structure, left thigh: Secondary | ICD-10-CM | POA: Insufficient documentation

## 2017-05-22 ENCOUNTER — Inpatient Hospital Stay
Admission: RE | Admit: 2017-05-22 | Discharge: 2017-05-22 | Disposition: A | Discharge status: Home / Self Care | Payer: Self-pay | Source: Ambulatory Visit | Attending: *Deleted | Admitting: *Deleted

## 2017-05-22 ENCOUNTER — Other Ambulatory Visit: Payer: Self-pay | Admitting: *Deleted

## 2017-05-22 DIAGNOSIS — Z9289 Personal history of other medical treatment: Secondary | ICD-10-CM

## 2017-06-09 ENCOUNTER — Other Ambulatory Visit: Payer: Self-pay | Admitting: Obstetrics and Gynecology

## 2017-06-09 DIAGNOSIS — Z1371 Encounter for nonprocreative screening for genetic disease carrier status: Secondary | ICD-10-CM

## 2017-06-09 MED ORDER — AMBULATORY NON FORMULARY MEDICATION: 6 refills | Status: DC

## 2017-06-09 NOTE — Progress Notes (Signed)
Pharmacy change to [facility] in [place]

## 2017-06-11 ENCOUNTER — Other Ambulatory Visit: Payer: Self-pay

## 2017-06-11 MED ORDER — AMBULATORY NON FORMULARY MEDICATION: 6 refills | Status: DC

## 2017-06-25 DIAGNOSIS — H02889 Meibomian gland dysfunction of unspecified eye, unspecified eyelid: Secondary | ICD-10-CM | POA: Diagnosis not present

## 2017-07-28 ENCOUNTER — Telehealth: Payer: Self-pay

## 2017-07-28 NOTE — Telephone Encounter (Signed)
Pt called triage today wanting to know if Dr. Bonney AidStaebler could prescribe her with something because she thinks she has a yeast infection. Called pt back and scheduled her an appt for tomorrow at 8:10 with staebler

## 2017-07-29 ENCOUNTER — Encounter: Payer: Self-pay | Admitting: Obstetrics and Gynecology

## 2017-07-29 ENCOUNTER — Ambulatory Visit: Payer: BLUE CROSS/BLUE SHIELD | Admitting: Obstetrics and Gynecology

## 2017-07-29 VITALS — BP 102/54 | HR 66 | Ht 63.0 in | Wt 117.0 lb

## 2017-07-29 DIAGNOSIS — B373 Candidiasis of vulva and vagina: Secondary | ICD-10-CM

## 2017-07-29 DIAGNOSIS — Z113 Encounter for screening for infections with a predominantly sexual mode of transmission: Secondary | ICD-10-CM | POA: Diagnosis not present

## 2017-07-29 DIAGNOSIS — B3731 Acute candidiasis of vulva and vagina: Secondary | ICD-10-CM

## 2017-07-29 DIAGNOSIS — N765 Ulceration of vagina: Secondary | ICD-10-CM

## 2017-07-29 MED ORDER — AMBULATORY NON FORMULARY MEDICATION: 6 refills | Status: DC

## 2017-07-29 MED ORDER — FLUCONAZOLE 150 MG PO TABS: 150.0000 mg | ORAL_TABLET | Freq: Once | ORAL | 0 refills | Status: AC

## 2017-07-29 NOTE — Progress Notes (Signed)
Obstetrics & Gynecology Office Visit   Chief Complaint:  Chief Complaint  Patient presents with  . Vaginal irritation    Vaginal swelling/milky discharge/no odor    History of Present Illness: [name] is a 67 y.o. [id number] who LMP was No LMP recorded. Patient is postmenopausal., presents today for a problem visit.   Patient complains of an abnormal vaginal discharge for 6 days. Discharge described as: white. Vaginal symptoms include burning, local irritation and swelling.   Other associated symptoms: none.Menstrual pattern:postmenopausal.  She denies recent antibiotic exposure, admits to changes in soaps, detergents coinciding with the onset of her symptoms.  She has not previously self treated or been under treatment by another provider for these symptoms.  No history of recurrent yeast infections.  The patient is not diabetic or otherwise imunocompromised.     Review of Systems: Review of Systems  Constitutional: Negative.   Genitourinary: Positive for dysuria. Negative for frequency, hematuria and urgency.  Skin: Positive for rash.   Past Medical History:  Past Medical History:  Diagnosis Date  . BRCA gene mutation negative 2017  . Medical history non-contributory     Past Surgical History:  Past Surgical History:  Procedure Laterality Date  . APPENDECTOMY    . CARPAL TUNNEL RELEASE  2011  . CATARACT EXTRACTION W/PHACO Right 11/18/2016   Procedure: CATARACT EXTRACTION PHACO AND INTRAOCULAR LENS PLACEMENT (IOC);  Surgeon: Birder Robson, MD;  Location: [facility] ORS;  Service: Ophthalmology;  Laterality: Right;  [place] 00:30.3 AP% 19.8 CDE 6.01 Fluid Pack lot # [id number]  . [place]  . ctr    . EYE SURGERY    . TUBAL LIGATION  1991    Gynecologic History: No LMP recorded. Patient is postmenopausal.  Obstetric History: [id number]  Family History:  Family History  Problem Relation Age of Onset  . Heart failure Maternal Grandfather   . Heart  failure Paternal Grandmother   . Breast cancer Paternal Aunt 97    Social History:  Social History   Socioeconomic History  . Marital status: Married    Spouse name: Not on file  . Number of children: Not on file  . Years of education: Not on file  . Highest education level: Not on file  Occupational History  . Not on file  Social Needs  . Financial resource strain: Not on file  . Food insecurity:    Worry: Not on file    Inability: Not on file  . Transportation needs:    Medical: Not on file    Non-medical: Not on file  Tobacco Use  . Smoking status: Never Smoker  . Smokeless tobacco: Never Used  Substance and Sexual Activity  . Alcohol use: No  . Drug use: No  . Sexual activity: Yes    Partners: Male    Birth control/protection: Post-menopausal  Lifestyle  . Physical activity:    Days per week: Not on file    Minutes per session: Not on file  . Stress: Not on file  Relationships  . Social connections:    Talks on phone: Not on file    Gets together: Not on file    Attends religious service: Not on file    Active member of club or organization: Not on file    Attends meetings of clubs or organizations: Not on file    Relationship status: Not on file  . Intimate partner violence:    Fear of current or ex partner:  Not on file    Emotionally abused: Not on file    Physically abused: Not on file    Forced sexual activity: Not on file  Other Topics Concern  . Not on file  Social History Narrative  . Not on file    Allergies:  No Known Allergies  Medications: Prior to Admission medications   Medication Sig Start Date End Date Taking? Authorizing Provider  estradiol (CLIMARA - DOSED IN MG/24 HR) 0.025 mg/24hr patch Place 1 patch (0.025 mg total) onto the skin once a week. 01/21/17  Yes Malachy Mood, MD  Fluticasone Propionate (FLONASE NA) Place into the nose.   Yes [provider]  UNABLE TO FIND Apply 0.5 mLs topically daily. PG 40 ml/ml plus  TESTOSTERONE 1/mg/ml    1/2 ml daily 11/12/16  Yes Malachy Mood, MD  AMBULATORY NON FORMULARY MEDICATION Medication Name: Progesterone 58m/mL Testosterone 123mmL Compounded Medication Apply 1/50m17mopical daily 07/29/17   StaMalachy MoodD  fluconazole (DIFLUCAN) 150 MG tablet Take 1 tablet (150 mg total) by mouth once for 1 dose. Can take additional dose three days later if symptoms persist 07/29/17 07/29/17  StaMalachy MoodD  XIIDRA 5 % SOLN  11/24/16   [provider]    Physical Exam Vitals:  Vitals:   07/29/17 0823  BP: (!) 102/54  Pulse: 66   No LMP recorded. Patient is postmenopausal.  General: NAD HEENT: normocephalic, anicteric Pulmonary: No increased work of breathing Genitourinary:  External: Some edema, minimal erythema of the labia major.  Normal urethral meatus, normal Bartholin's and Skene's glands.  The posterior fourchette has a small mucosal tear/ulceration 50mm16md is tender to touch with Q-tip, exhibits minimal bleeding  Vagina: Normal vaginal mucosa, no evidence of prolapse.   Rectal: deferred  Lymphatic: no evidence of inguinal lymphadenopathy Extremities: no edema, erythema, or tenderness Neurologic: Grossly intact Psychiatric: mood appropriate, affect full  Female chaperone present for pelvic  portions of the physical exam  Wet Prep: PH: 4.5 Clue Cells: Negative Fungal elements: Positive both hyphea and budding yeast Trichomonas: Negative   Assessment: 66 y68. G4P2H7G9021h vaginal candida and ulcerative mucosal lesion in the posterior fourchette   Plan: Problem List Items Addressed This Visit    None    Visit Diagnoses    Vaginal mucous membrane ulceration    -  Primary   Relevant Orders   Candida 6 Species Profile, NAA   HSV NAA   Routine screening for STI (sexually transmitted infection)       Relevant Orders   Candida 6 Species Profile, NAA   HSV NAA   Vaginal candida       Relevant Medications   fluconazole (DIFLUCAN)  150 MG tablet   Other Relevant Orders   Candida 6 Species Profile, NAA     1) Risk factors for bacterial vaginosis and candida infections discussed.  We discussed normal vaginal flora/microbiome.  Any factors that may alter the microbiome increase the risk of these opportunistic infections.  These include changes in pH, antibiotic exposures, diabetes, wet bathing suits etc.  We discussed that treatment is aimed at eradicating abnormal bacterial overgrowth and or yeast.  There may be some role for vaginal probiotics in restoring normal vaginal flora. - Samples of premarin for small area of ulceration posterior fourchette  - rx diflucan - NuSwab select candida 6 species and HSV sent   AndrMalachy Mood, FACOHelena [place] 07/29/2017, 10:32 AM

## 2017-08-03 LAB — HSV NAA
HSV 1 NAA: NEGATIVE
HSV 2 NAA: NEGATIVE

## 2017-08-03 LAB — CANDIDA 6 SPECIES PROFILE, NAA
CANDIDA GLABRATA, NAA: NEGATIVE
CANDIDA PARAPSILOSIS, NAA: NEGATIVE
CANDIDA TROPICALIS, NAA: NEGATIVE
Candida albicans, NAA: NEGATIVE
Candida krusei, NAA: NEGATIVE
Candida lusitaniae, NAA: NEGATIVE

## 2017-08-04 ENCOUNTER — Encounter (INDEPENDENT_AMBULATORY_CARE_PROVIDER_SITE_OTHER): Payer: Self-pay

## 2017-10-02 DIAGNOSIS — J019 Acute sinusitis, unspecified: Secondary | ICD-10-CM | POA: Diagnosis not present

## 2017-12-16 DIAGNOSIS — M3501 Sicca syndrome with keratoconjunctivitis: Secondary | ICD-10-CM | POA: Diagnosis not present

## 2018-02-03 ENCOUNTER — Other Ambulatory Visit: Payer: Self-pay | Admitting: Obstetrics and Gynecology

## 2018-02-22 MED ORDER — ESTRADIOL 0.025 MG/24HR TD PTWK: 0.0250 mg | MEDICATED_PATCH | TRANSDERMAL | 3 refills | Status: DC

## 2018-02-22 MED ORDER — AMBULATORY NON FORMULARY MEDICATION: 6 refills | Status: DC

## 2018-02-25 ENCOUNTER — Other Ambulatory Visit: Payer: Self-pay

## 2018-02-25 MED ORDER — AMBULATORY NON FORMULARY MEDICATION: 6 refills | Status: DC

## 2018-03-12 ENCOUNTER — Encounter: Payer: Self-pay | Admitting: Obstetrics and Gynecology

## 2018-03-12 ENCOUNTER — Ambulatory Visit (INDEPENDENT_AMBULATORY_CARE_PROVIDER_SITE_OTHER): Payer: BLUE CROSS/BLUE SHIELD | Admitting: Obstetrics and Gynecology

## 2018-03-12 VITALS — BP 106/56 | Ht 63.5 in | Wt 118.0 lb

## 2018-03-12 DIAGNOSIS — Z1211 Encounter for screening for malignant neoplasm of colon: Secondary | ICD-10-CM

## 2018-03-12 DIAGNOSIS — N941 Unspecified dyspareunia: Secondary | ICD-10-CM

## 2018-03-12 DIAGNOSIS — Z1239 Encounter for other screening for malignant neoplasm of breast: Secondary | ICD-10-CM

## 2018-03-12 DIAGNOSIS — Z79890 Hormone replacement therapy (postmenopausal): Secondary | ICD-10-CM

## 2018-03-12 DIAGNOSIS — Z01419 Encounter for gynecological examination (general) (routine) without abnormal findings: Secondary | ICD-10-CM | POA: Diagnosis not present

## 2018-03-12 MED ORDER — AMBULATORY NON FORMULARY MEDICATION: 6 refills | Status: DC

## 2018-03-12 MED ORDER — ESTRADIOL 0.025 MG/24HR TD PTWK: 0.0250 mg | MEDICATED_PATCH | TRANSDERMAL | 3 refills | Status: DC

## 2018-03-12 NOTE — Progress Notes (Signed)
Gynecology Annual Exam  PCP: Kirk Ruths, MD  Chief Complaint:  Chief Complaint  Patient presents with  . Gynecologic Exam    History of Present Illness:Patient is a 67 y.o. [id number] presents for annual exam. The patient has no complaints today.   LMP: No LMP recorded. Patient is postmenopausal. No vaginal bleeding  The patient is sexually active. She denies dyspareunia.  The patient does perform self breast exams.  There is notable family history of breast or ovarian cancer in her family.  Negative MyRisk testing previously  The patient wears seatbelts: yes.   The patient has regular exercise: not asked.    The patient denies current symptoms of depression.     Review of Systems: Review of Systems  Constitutional: Negative for chills and fever.  HENT: Negative for congestion.   Respiratory: Negative for cough and shortness of breath.   Cardiovascular: Negative for chest pain and palpitations.  Gastrointestinal: Negative for abdominal pain, constipation, diarrhea, heartburn, nausea and vomiting.  Genitourinary: Negative for dysuria, frequency and urgency.  Skin: Negative for itching and rash.  Neurological: Negative for dizziness and headaches.  Endo/Heme/Allergies: Negative for polydipsia.  Psychiatric/Behavioral: Negative for depression.    Past Medical History:  Past Medical History:  Diagnosis Date  . BRCA gene mutation negative 2017  . Medical history non-contributory     Past Surgical History:  Past Surgical History:  Procedure Laterality Date  . APPENDECTOMY    . CARPAL TUNNEL RELEASE  2011  . CATARACT EXTRACTION W/PHACO Right 11/18/2016   Procedure: CATARACT EXTRACTION PHACO AND INTRAOCULAR LENS PLACEMENT (IOC);  Surgeon: Birder Robson, MD;  Location: [facility] ORS;  Service: Ophthalmology;  Laterality: Right;  [place] 00:30.3 AP% 19.8 CDE 6.01 Fluid Pack lot # [id number]  . [place]  . ctr    . EYE SURGERY    . TUBAL LIGATION  1991     Gynecologic History:  No LMP recorded. Patient is postmenopausal. Last Pap: Results were: 2016 no abnormalities Last mammogram: 05/19/17 Results were: Gillian Shields I  Obstetric History: [id number]  Family History:  Family History  Problem Relation Age of Onset  . Heart failure Maternal Grandfather   . Heart failure Paternal Grandmother   . Breast cancer Paternal Aunt 85    Social History:  Social History   Socioeconomic History  . Marital status: Married    Spouse name: Not on file  . Number of children: Not on file  . Years of education: Not on file  . Highest education level: Not on file  Occupational History  . Not on file  Social Needs  . Financial resource strain: Not on file  . Food insecurity:    Worry: Not on file    Inability: Not on file  . Transportation needs:    Medical: Not on file    Non-medical: Not on file  Tobacco Use  . Smoking status: Never Smoker  . Smokeless tobacco: Never Used  Substance and Sexual Activity  . Alcohol use: No  . Drug use: No  . Sexual activity: Yes    Partners: Male    Birth control/protection: Post-menopausal  Lifestyle  . Physical activity:    Days per week: Not on file    Minutes per session: Not on file  . Stress: Not on file  Relationships  . Social connections:    Talks on phone: Not on file    Gets together: Not on file    Attends religious service: Not  on file    Active member of club or organization: Not on file    Attends meetings of clubs or organizations: Not on file    Relationship status: Not on file  . Intimate partner violence:    Fear of current or ex partner: Not on file    Emotionally abused: Not on file    Physically abused: Not on file    Forced sexual activity: Not on file  Other Topics Concern  . Not on file  Social History Narrative  . Not on file    Allergies:  No Known Allergies  Medications: Prior to Admission medications   Medication Sig Start Date End Date Taking? Authorizing  Provider  AMBULATORY NON FORMULARY MEDICATION Medication Name: Progesterone 45m/mL Testosterone 125mmL Compounded Medication Apply 1/79m29mopical daily 02/25/18   StaMalachy MoodD  estradiol (CLIMARA - DOSED IN MG/24 HR) 0.025 mg/24hr patch Place 1 patch (0.025 mg total) onto the skin once a week. 02/22/18   StaMalachy MoodD  Fluticasone Propionate (FLONASE NA) Place into the nose.    [provider]  UNABLE TO FIND Apply 0.5 mLs topically daily. PG 40 ml/ml plus TESTOSTERONE 1/mg/ml    1/2 ml daily 11/12/16   StaMalachy MoodD  XIIShirley Friar% SOLN  11/24/16   [provider]    Physical Exam Vitals: Blood pressure (!) 106/56, height 5' 3.5" (1.613 m), weight 118 lb (53.5 kg).   General: NAD HEENT: normocephalic, anicteric Thyroid: no enlargement, no palpable nodules Pulmonary: No increased work of breathing, CTAB Cardiovascular: RRR, distal pulses 2+ Breast: Breast symmetrical, no tenderness, no palpable nodules or masses, no skin or nipple retraction present, no nipple discharge.  No axillary or supraclavicular lymphadenopathy. Abdomen: NABS, soft, non-tender, non-distended.  Umbilicus without lesions.  No hepatomegaly, splenomegaly or masses palpable. No evidence of hernia  Genitourinary:  External: Normal external female genitalia.  Normal urethral meatus, normal Bartholin's and Skene's glands.    Vagina: Normal vaginal mucosa, no evidence of prolapse.    Cervix: Grossly normal in appearance, no bleeding  Uterus: Non-enlarged, mobile, normal contour.  No CMT  Adnexa: ovaries non-enlarged, no adnexal masses  Rectal: deferred  Lymphatic: no evidence of inguinal lymphadenopathy Extremities: no edema, erythema, or tenderness Neurologic: Grossly intact Psychiatric: mood appropriate, affect full  Female chaperone present for pelvic and breast  portions of the physical exam     Assessment: [age]o. [id number] annual exam  Plan: Problem List Items  Addressed This Visit    None    Visit Diagnoses    Breast screening    -  Primary   Relevant Orders   MM 3D SCREEN BREAST BILATERAL   Encounter for gynecological examination without abnormal finding       Hormone replacement therapy       Dyspareunia in female       Colon cancer screening       Relevant Orders   Ambulatory referral to Gastroenterology      1) Mammogram - recommend yearly screening mammogram.  Mammogram Is up to date  - ordered for January  2) STI screening  was notoffered and therefore not obtained  3) ASCCP guidelines and rational discussed.  Discontinue secondary to age >65>44) Osteoporosis  - DEXA 05/19/2017 GE Lunar 0.782g/cm2 - FRAX 10 year major fracture risk 9.3%,  10 year hip fracture risk 1.5%  Consider FDA-approved medical therapies in postmenopausal women and men aged 50 5ars and older, based on the following:  a) A hip or vertebral (clinical or morphometric) fracture b) T-score ? -2.5 at the femoral neck or spine after appropriate evaluation to exclude secondary causes C) Low bone mass (T-score between -1.0 and -2.5 at the femoral neck or spine) and a 10-year probability of a hip fracture ? 3% or a 10-year probability of a major osteoporosis-related fracture ? 20% based on the [place]-adapted WHO algorithm   5) Routine healthcare maintenance including cholesterol, diabetes screening discussed managed by PCP  6) Colonoscopy - 01/12/2012 Colonoscopy - ordered polyps  7) Dysparunia - samples of intrarosa  8) HRT - refilled discussed findins of WHIMS trial and increased dementia risk given mothers history  9)  Return in about 1 year (around 03/13/2019) for annual.    Malachy Mood, MD [place], [facility] 03/12/2018, 2:28 PM

## 2018-03-12 NOTE — Patient Instructions (Signed)
[facility] [address] [place] [address]  [facility]  [address] [place] [address]  Phone: [phone]  

## 2018-04-14 DIAGNOSIS — Z1211 Encounter for screening for malignant neoplasm of colon: Secondary | ICD-10-CM | POA: Diagnosis not present

## 2018-04-14 DIAGNOSIS — Z8371 Family history of colonic polyps: Secondary | ICD-10-CM | POA: Diagnosis not present

## 2018-04-14 DIAGNOSIS — R141 Gas pain: Secondary | ICD-10-CM | POA: Diagnosis not present

## 2018-10-22 DIAGNOSIS — S00262A Insect bite (nonvenomous) of left eyelid and periocular area, initial encounter: Secondary | ICD-10-CM | POA: Diagnosis not present

## 2018-10-25 DIAGNOSIS — H26491 Other secondary cataract, right eye: Secondary | ICD-10-CM | POA: Diagnosis not present

## 2018-10-25 DIAGNOSIS — H43813 Vitreous degeneration, bilateral: Secondary | ICD-10-CM | POA: Diagnosis not present

## 2018-12-30 ENCOUNTER — Other Ambulatory Visit: Payer: Self-pay | Admitting: Obstetrics and Gynecology

## 2018-12-30 MED ORDER — INTRAROSA 6.5 MG VA INST: 6.5000 mg | VAGINAL_INSERT | Freq: Every day | VAGINAL | 11 refills | Status: DC

## 2019-07-27 ENCOUNTER — Other Ambulatory Visit: Payer: Self-pay | Admitting: Obstetrics and Gynecology

## 2019-07-27 MED ORDER — ESTRADIOL 0.1 MG/GM VA CREA: 1.0000 g | TOPICAL_CREAM | VAGINAL | 3 refills | Status: DC

## 2019-08-22 ENCOUNTER — Ambulatory Visit: Payer: Medicare Other | Admitting: Obstetrics and Gynecology

## 2019-08-25 ENCOUNTER — Ambulatory Visit (INDEPENDENT_AMBULATORY_CARE_PROVIDER_SITE_OTHER): Payer: BC Managed Care – PPO | Admitting: Obstetrics and Gynecology

## 2019-08-25 ENCOUNTER — Other Ambulatory Visit (HOSPITAL_COMMUNITY)
Admission: RE | Admit: 2019-08-25 | Discharge: 2019-08-25 | Disposition: A | Discharge status: Home / Self Care | Payer: BC Managed Care – PPO | Source: Ambulatory Visit | Attending: Obstetrics and Gynecology | Admitting: Obstetrics and Gynecology

## 2019-08-25 ENCOUNTER — Encounter: Payer: Self-pay | Admitting: Obstetrics and Gynecology

## 2019-08-25 ENCOUNTER — Other Ambulatory Visit: Payer: Self-pay

## 2019-08-25 VITALS — BP 110/68 | Ht 63.0 in | Wt 118.0 lb

## 2019-08-25 DIAGNOSIS — Z1239 Encounter for other screening for malignant neoplasm of breast: Secondary | ICD-10-CM

## 2019-08-25 DIAGNOSIS — N952 Postmenopausal atrophic vaginitis: Secondary | ICD-10-CM

## 2019-08-25 DIAGNOSIS — Z01419 Encounter for gynecological examination (general) (routine) without abnormal findings: Secondary | ICD-10-CM | POA: Diagnosis not present

## 2019-08-25 DIAGNOSIS — Z124 Encounter for screening for malignant neoplasm of cervix: Secondary | ICD-10-CM

## 2019-08-25 MED ORDER — ESTRADIOL 0.1 MG/GM VA CREA: 1.0000 g | TOPICAL_CREAM | VAGINAL | 3 refills | Status: DC

## 2019-08-25 NOTE — Patient Instructions (Signed)
[facility] [address] [place] [address]  [facility]  [address] [place] [address]  Phone: [phone]  

## 2019-08-25 NOTE — Progress Notes (Signed)
Gynecology Annual Exam  PCP: Kirk Ruths, MD  Chief Complaint:  Chief Complaint  Patient presents with  . Gynecologic Exam    History of Present Illness:Patient is a 69 y.o. [id number] presents for annual exam. The patient has no complaints today.   LMP: No LMP recorded. Patient is postmenopausal. No postmenopausal bleeding  The patient is sexually active. She admits to dyspareunia, with improvement since switching from intrarosa to estrace cream.  The patient does perform self breast exams.  There is no notable family history of breast or ovarian cancer in her family.  The patient wears seatbelts: yes.   The patient has regular exercise: not asked.    The patient denies current symptoms of depression.     Review of Systems: Review of Systems  Constitutional: Negative for chills and fever.  HENT: Negative for congestion.   Respiratory: Negative for cough and shortness of breath.   Cardiovascular: Positive for chest pain. Negative for palpitations, orthopnea, claudication and leg swelling.  Gastrointestinal: Negative for abdominal pain, constipation, diarrhea, heartburn, nausea and vomiting.  Genitourinary: Negative for dysuria, frequency and urgency.  Skin: Negative for itching and rash.  Neurological: Negative for dizziness and headaches.  Endo/Heme/Allergies: Negative for polydipsia.  Psychiatric/Behavioral: Negative for depression.    Past Medical History:  Patient Active Problem List   Diagnosis Date Noted  . BRCA negative 06/09/2017    08/27/2012 Myriad BRCA1 and BRCA2 negative   . Allergic rhinitis 08/06/2013  . Hyperlipidemia 08/06/2013    Last Assessment & Plan:  Formatting of this note might be different from the original. Low fat diet is being attempted and no significant side effects such as myalgia's are noted.   . Menopausal syndrome (hot flushes) 08/06/2013    Past Surgical History:  Past Surgical History:  Procedure Laterality Date  .  APPENDECTOMY    . CARPAL TUNNEL RELEASE  2011  . CATARACT EXTRACTION W/PHACO Right 11/18/2016   Procedure: CATARACT EXTRACTION PHACO AND INTRAOCULAR LENS PLACEMENT (IOC);  Surgeon: Birder Robson, MD;  Location: [facility] ORS;  Service: Ophthalmology;  Laterality: Right;  [place] 00:30.3 AP% 19.8 CDE 6.01 Fluid Pack lot # [id number]  . [facility]  . ctr    . EYE SURGERY    . TUBAL LIGATION  1991    Gynecologic History:  No LMP recorded. Patient is postmenopausal. Last Pap: Results were: 11/30/2014 NIL and HR HPV negative   Obstetric History: [id number]  Family History:  Family History  Problem Relation Age of Onset  . Heart failure Maternal Grandfather   . Heart failure Paternal Grandmother   . Breast cancer Paternal Aunt 79    Social History:  Social History   Socioeconomic History  . Marital status: Married    Spouse name: Not on file  . Number of children: Not on file  . Years of education: Not on file  . Highest education level: Not on file  Occupational History  . Not on file  Tobacco Use  . Smoking status: Never Smoker  . Smokeless tobacco: Never Used  Substance and Sexual Activity  . Alcohol use: No  . Drug use: No  . Sexual activity: Yes    Partners: Male    Birth control/protection: Post-menopausal  Other Topics Concern  . Not on file  Social History Narrative  . Not on file   Social Determinants of Health   Financial Resource Strain:   . Difficulty of Paying Living Expenses:   Food Insecurity:   .  Worried About Charity fundraiser in the Last Year:   . Arboriculturist in the Last Year:   Transportation Needs:   . Film/video editor (Medical):   Marland Kitchen Lack of Transportation (Non-Medical):   Physical Activity:   . Days of Exercise per Week:   . Minutes of Exercise per Session:   Stress:   . Feeling of Stress :   Social Connections:   . Frequency of Communication with Friends and Family:   . Frequency of Social Gatherings with Friends and  Family:   . Attends Religious Services:   . Active Member of Clubs or Organizations:   . Attends Archivist Meetings:   Marland Kitchen Marital Status:   Intimate Partner Violence:   . Fear of Current or Ex-Partner:   . Emotionally Abused:   Marland Kitchen Physically Abused:   . Sexually Abused:     Allergies:  No Known Allergies  Medications: Prior to Admission medications   Medication Sig Start Date End Date Taking? Authorizing Provider  estradiol (ESTRACE VAGINAL) 0.1 MG/GM vaginal cream Place 1 g vaginally 2 (two) times a week. 07/28/19  Yes Malachy Mood, MD  Fluticasone Propionate (FLONASE NA) Place into the nose.    [provider]  omega-3 acid ethyl esters (LOVAZA) 1 g capsule Take by mouth 2 (two) times daily.    [provider]  Shirley Friar 5 % SOLN  07/18/19   [provider]    Physical Exam Vitals: Blood pressure 110/68, height 5' 3"  (1.6 m), weight 118 lb (53.5 kg).  General: NAD HEENT: normocephalic, anicteric Thyroid: no enlargement, no palpable nodules Pulmonary: No increased work of breathing, CTAB Cardiovascular: RRR, distal pulses 2+ Breast: Breast symmetrical, no tenderness, no palpable nodules or masses, no skin or nipple retraction present, no nipple discharge.  No axillary or supraclavicular lymphadenopathy. Abdomen: NABS, soft, non-tender, non-distended.  Umbilicus without lesions.  No hepatomegaly, splenomegaly or masses palpable. No evidence of hernia  Genitourinary:  External: Normal external female genitalia.  Normal urethral meatus, normal Bartholin's and Skene's glands.    Vagina: Normal vaginal mucosa, no evidence of prolapse.    Cervix: Grossly normal in appearance, no bleeding  Uterus: Non-enlarged, mobile, normal contour.  No CMT  Adnexa: ovaries non-enlarged, no adnexal masses  Rectal: deferred  Lymphatic: no evidence of inguinal lymphadenopathy Extremities: no edema, erythema, or tenderness Neurologic: Grossly intact Psychiatric:  mood appropriate, affect full  Female chaperone present for pelvic and breast  portions of the physical exam     Assessment: 69 y.o. [id number] routine annual exam  Plan: Problem List Items Addressed This Visit    None    Visit Diagnoses    Encounter for gynecological examination without abnormal finding    -  Primary   Breast screening       Relevant Orders   MM 3D SCREEN BREAST BILATERAL   Screening for malignant neoplasm of cervix       Relevant Orders   Cytology - PAP   Vaginal atrophy          1) Mammogram - recommend yearly screening mammogram.  Mammogram Was ordered today  2) STI screening  was notoffered and therefore not obtained  3) ASCCP guidelines and rational discussed.  We discussed stopping screening would be appropriate given age >76.  Patient opts to continue q3year screening interval  4) Osteoporosis  - per USPTF routine screening DEXA at age 58 - Osteopenia 05/19/2017  Consider FDA-approved medical therapies in postmenopausal women  and men aged 69 years and older, based on the following: a) A hip or vertebral (clinical or morphometric) fracture b) T-score ? -2.5 at the femoral neck or spine after appropriate evaluation to exclude secondary causes C) Low bone mass (T-score between -1.0 and -2.5 at the femoral neck or spine) and a 10-year probability of a hip fracture ? 3% or a 10-year probability of a major osteoporosis-related fracture ? 20% based on the [place]-adapted WHO algorithm   5) Routine healthcare maintenance including cholesterol, diabetes screening discussed managed by PCP  6) Occasional chest tigthness - feelsl was related to stressors such as her mom's death, COVID, etc.  Discussed cardiology referral for angina but patient declines at present.  I discussed that these may be panic attack related but given atypical presentation of MI in women and age I would strongly recommend and would be willing to arrange.  7) Return in about 1 year (around  08/24/2020) for annual.    Malachy Mood, MD [facility], [facility] 08/25/2019, 3:50 PM

## 2019-08-29 LAB — CYTOLOGY - PAP
Comment: NEGATIVE
Diagnosis: NEGATIVE
High risk HPV: NEGATIVE

## 2019-10-20 ENCOUNTER — Other Ambulatory Visit: Payer: Self-pay

## 2019-10-20 MED ORDER — ESTRADIOL 0.1 MG/GM VA CREA: 1.0000 g | TOPICAL_CREAM | VAGINAL | 0 refills | Status: DC

## 2019-11-28 ENCOUNTER — Telehealth: Payer: Self-pay

## 2019-11-28 NOTE — Telephone Encounter (Signed)
Fax received from [facility] requesting a 90 day supply w/3 refills for Estradiol vag cream 42.5gm

## 2019-12-06 ENCOUNTER — Other Ambulatory Visit: Payer: Self-pay | Admitting: Obstetrics and Gynecology

## 2019-12-06 ENCOUNTER — Telehealth: Payer: Self-pay

## 2019-12-06 MED ORDER — ESTRADIOL 0.1 MG/GM VA CREA: 1.0000 g | TOPICAL_CREAM | VAGINAL | 3 refills | Status: DC

## 2019-12-06 NOTE — Telephone Encounter (Signed)
[facility] faxed over a request for a 90 supply of Estradiol Vag. Cream 42.5mg 

## 2020-01-17 ENCOUNTER — Other Ambulatory Visit: Payer: Self-pay | Admitting: Obstetrics and Gynecology

## 2020-01-17 NOTE — Telephone Encounter (Signed)
Advise

## 2020-09-14 ENCOUNTER — Other Ambulatory Visit (HOSPITAL_COMMUNITY)
Admission: RE | Admit: 2020-09-14 | Discharge: 2020-09-14 | Disposition: A | Discharge status: Home / Self Care | Payer: BC Managed Care – PPO | Source: Ambulatory Visit | Attending: Obstetrics and Gynecology | Admitting: Obstetrics and Gynecology

## 2020-09-14 ENCOUNTER — Ambulatory Visit (INDEPENDENT_AMBULATORY_CARE_PROVIDER_SITE_OTHER): Payer: BC Managed Care – PPO | Admitting: Obstetrics and Gynecology

## 2020-09-14 ENCOUNTER — Encounter: Payer: Self-pay | Admitting: Obstetrics and Gynecology

## 2020-09-14 ENCOUNTER — Other Ambulatory Visit: Payer: Self-pay

## 2020-09-14 VITALS — BP 110/62 | HR 56 | Ht 63.5 in | Wt 114.0 lb

## 2020-09-14 DIAGNOSIS — Z124 Encounter for screening for malignant neoplasm of cervix: Secondary | ICD-10-CM

## 2020-09-14 DIAGNOSIS — Z1231 Encounter for screening mammogram for malignant neoplasm of breast: Secondary | ICD-10-CM | POA: Diagnosis not present

## 2020-09-14 DIAGNOSIS — Z1239 Encounter for other screening for malignant neoplasm of breast: Secondary | ICD-10-CM | POA: Diagnosis not present

## 2020-09-14 DIAGNOSIS — Z01419 Encounter for gynecological examination (general) (routine) without abnormal findings: Secondary | ICD-10-CM

## 2020-09-14 MED ORDER — ESTRADIOL 0.1 MG/GM VA CREA: TOPICAL_CREAM | VAGINAL | 3 refills | Status: DC

## 2020-09-14 NOTE — Progress Notes (Signed)
Gynecology Annual Exam  PCP: Kirk Ruths, MD  Chief Complaint:  Chief Complaint  Patient presents with  . Gynecologic Exam    Annual - no concerns. RM 5    History of Present Illness:Patient is a 70 y.o. [name] presents for annual exam. The patient has no complaints today.   LMP: No LMP recorded. Patient is postmenopausal. No postmenopausal bleeding  The patient is sexually active. She denies dyspareunia, good response to vaginal estrogen.  The patient does perform self breast exams.  There is no notable family history of breast or ovarian cancer in her family.  The patient wears seatbelts: yes.   The patient has regular exercise: not asked.    The patient denies current symptoms of depression.     Review of Systems: Review of Systems  Constitutional: Negative for chills and fever.  HENT: Negative for congestion.   Respiratory: Negative for cough and shortness of breath.   Cardiovascular: Negative for chest pain and palpitations.  Gastrointestinal: Negative for abdominal pain, constipation, diarrhea, heartburn, nausea and vomiting.  Genitourinary: Negative for dysuria, frequency and urgency.  Skin: Negative for itching and rash.  Neurological: Negative for dizziness and headaches.  Endo/Heme/Allergies: Negative for polydipsia.  Psychiatric/Behavioral: Negative for depression.    Past Medical History:  Patient Active Problem List   Diagnosis Date Noted  . BRCA negative 06/09/2017    08/27/2012 Myriad BRCA1 and BRCA2 negative   . Allergic rhinitis 08/06/2013  . Hyperlipidemia 08/06/2013    Last Assessment & Plan:  Formatting of this note might be different from the original. Low fat diet is being attempted and no significant side effects such as myalgia's are noted.   . Menopausal syndrome (hot flushes) 08/06/2013    Past Surgical History:  Past Surgical History:  Procedure Laterality Date  . APPENDECTOMY    . CARPAL TUNNEL RELEASE  2011  .  CATARACT EXTRACTION W/PHACO Right 11/18/2016   Procedure: CATARACT EXTRACTION PHACO AND INTRAOCULAR LENS PLACEMENT (IOC);  Surgeon: Birder Robson, MD;  Location: [facility] ORS;  Service: Ophthalmology;  Laterality: Right;  [place] 00:30.3 AP% 19.8 CDE 6.01 Fluid Pack lot # [id number]  . [facility]  . ctr    . EYE SURGERY    . TUBAL LIGATION  1991    Gynecologic History:  No LMP recorded. Patient is postmenopausal. Last Pap: Results were: 08/25/2019 NIL and HR HPV negative  Last mammogram: 05/22/2017 Results were: Gillian Shields I  Obstetric History: [id number]  Family History:  Family History  Problem Relation Age of Onset  . Heart failure Maternal Grandfather   . Heart failure Paternal Grandmother   . Breast cancer Paternal Aunt 73    Social History:  Social History   Socioeconomic History  . Marital status: Married    Spouse name: Not on file  . Number of children: Not on file  . Years of education: Not on file  . Highest education level: Not on file  Occupational History  . Not on file  Tobacco Use  . Smoking status: Never Smoker  . Smokeless tobacco: Never Used  Vaping Use  . Vaping Use: Never used  Substance and Sexual Activity  . Alcohol use: No  . Drug use: No  . Sexual activity: Yes    Partners: Male    Birth control/protection: Post-menopausal  Other Topics Concern  . Not on file  Social History Narrative  . Not on file   Social Determinants of Health   Financial Resource  Strain: Not on file  Food Insecurity: Not on file  Transportation Needs: Not on file  Physical Activity: Not on file  Stress: Not on file  Social Connections: Not on file  Intimate Partner Violence: Not on file    Allergies:  No Known Allergies  Medications: Prior to Admission medications   Medication Sig Start Date End Date Taking? Authorizing Provider  estradiol (ESTRACE) 0.1 MG/GM vaginal cream PLACE 1 GRAM VAGINALLY 2 TIMES A WEEK 01/17/20  Yes Staebler, Andreas, MD   Fluticasone Propionate (FLONASE NA) Place into the nose.   Yes [provider]  omega-3 acid ethyl esters (LOVAZA) 1 g capsule Take by mouth 2 (two) times daily.   Yes [provider]  XIIDRA 5 % SOLN  07/18/19  Yes [provider]    Physical Exam Vitals: Blood pressure 110/62, pulse (!) 56, height 5' 3.5" (1.613 m), weight 114 lb (51.7 kg).  General: NAD HEENT: normocephalic, anicteric Thyroid: no enlargement, no palpable nodules Pulmonary: No increased work of breathing, CTAB Cardiovascular: RRR, distal pulses 2+ Breast: Breast symmetrical, no tenderness, no palpable nodules or masses, no skin or nipple retraction present, no nipple discharge.  No axillary or supraclavicular lymphadenopathy. Abdomen: NABS, soft, non-tender, non-distended.  Umbilicus without lesions.  No hepatomegaly, splenomegaly or masses palpable. No evidence of hernia  Genitourinary:  External: Normal external female genitalia.  Normal urethral meatus, normal Bartholin's and Skene's glands.    Vagina: Normal vaginal mucosa, no evidence of prolapse.    Cervix: Grossly normal in appearance, no bleeding  Uterus: Non-enlarged, mobile, normal contour.  No CMT  Adnexa: ovaries non-enlarged, no adnexal masses  Rectal: deferred  Lymphatic: no evidence of inguinal lymphadenopathy Extremities: no edema, erythema, or tenderness Neurologic: Grossly intact Psychiatric: mood appropriate, affect full  Female chaperone present for pelvic and breast  portions of the physical exam     Assessment: 69 y.o. G4P2022 routine annual exam  Plan: Problem List Items Addressed This Visit   None   Visit Diagnoses    Encounter for gynecological examination without abnormal finding    -  Primary   Breast screening       Breast cancer screening by mammogram       Relevant Orders   MM 3D SCREEN BREAST BILATERAL      1) Mammogram - recommend yearly screening mammogram.  Mammogram Was ordered today  2)  STI screening  was notoffered and therefore not obtained  3) ASCCP guidelines and rational discussed.  Patient opts for every 3 years screening interval  4) Osteoporosis  - per USPTF routine screening DEXA at age 65 - last 2019 osteopenia  Consider FDA-approved medical therapies in postmenopausal women and men aged 50 years and older, based on the following: a) A hip or vertebral (clinical or morphometric) fracture b) T-score ? -2.5 at the femoral neck or spine after appropriate evaluation to exclude secondary causes C) Low bone mass (T-score between -1.0 and -2.5 at the femoral neck or spine) and a 10-year probability of a hip fracture ? 3% or a 10-year probability of a major osteoporosis-related fracture ? 20% based on the [place]-adapted WHO algorithm  5) Vaginal Atrophy - refill estrace cream  6) Routine healthcare maintenance including cholesterol, diabetes screening discussed managed by PCP  7) Return in about 1 year (around 09/14/2021) for annual.    Andreas Staebler, MD [facility], [facility] 09/14/2020, 8:58 AM            

## 2020-09-14 NOTE — Patient Instructions (Signed)
[facility] [address] [place] [address]  [facility]  [address] [place] [address]  Phone: [phone]  

## 2020-09-18 LAB — CYTOLOGY - PAP
Comment: NEGATIVE
Diagnosis: NEGATIVE
High risk HPV: NEGATIVE

## 2020-10-10 ENCOUNTER — Ambulatory Visit
Admission: RE | Admit: 2020-10-10 | Discharge: 2020-10-10 | Disposition: A | Discharge status: Home / Self Care | Payer: BC Managed Care – PPO | Source: Ambulatory Visit | Attending: Obstetrics and Gynecology | Admitting: Obstetrics and Gynecology

## 2020-10-10 ENCOUNTER — Other Ambulatory Visit: Payer: Self-pay

## 2020-10-10 DIAGNOSIS — Z1231 Encounter for screening mammogram for malignant neoplasm of breast: Secondary | ICD-10-CM | POA: Diagnosis present

## 2021-02-06 ENCOUNTER — Other Ambulatory Visit: Payer: Self-pay | Admitting: Obstetrics and Gynecology

## 2021-02-14 ENCOUNTER — Other Ambulatory Visit: Payer: Self-pay

## 2021-02-14 MED ORDER — ESTRADIOL 0.1 MG/GM VA CREA: TOPICAL_CREAM | VAGINAL | 3 refills | Status: DC

## 2021-02-14 NOTE — Telephone Encounter (Signed)
Pt calling needs refill of estradiol send to [facility] d/t her recent retirement; MyChart states 'refill not available.'  [phone]  Pt aware refill eRx'd to [facility].

## 2021-09-16 ENCOUNTER — Other Ambulatory Visit: Payer: Self-pay | Admitting: Ophthalmology

## 2021-09-16 DIAGNOSIS — H53461 Homonymous bilateral field defects, right side: Secondary | ICD-10-CM

## 2021-09-19 ENCOUNTER — Ambulatory Visit
Admission: RE | Admit: 2021-09-19 | Discharge: 2021-09-19 | Disposition: A | Discharge status: Home / Self Care | Payer: Medicare HMO | Source: Ambulatory Visit | Attending: Ophthalmology | Admitting: Ophthalmology

## 2021-09-19 DIAGNOSIS — H53461 Homonymous bilateral field defects, right side: Secondary | ICD-10-CM | POA: Insufficient documentation

## 2021-09-19 MED ORDER — GADOBUTROL 1 MMOL/ML IV SOLN
5.0000 mL | Freq: Once | INTRAVENOUS | Status: AC | PRN
  Administered 2021-09-19: 5 mL via INTRAVENOUS

## 2022-04-08 ENCOUNTER — Other Ambulatory Visit: Payer: Self-pay | Admitting: Internal Medicine

## 2022-04-08 DIAGNOSIS — Z1231 Encounter for screening mammogram for malignant neoplasm of breast: Secondary | ICD-10-CM

## 2022-04-24 ENCOUNTER — Ambulatory Visit: Payer: Medicare HMO | Admitting: Obstetrics and Gynecology

## 2022-05-13 ENCOUNTER — Ambulatory Visit: Payer: Medicare HMO | Admitting: Obstetrics and Gynecology

## 2022-05-15 ENCOUNTER — Encounter: Payer: Self-pay | Admitting: Obstetrics and Gynecology

## 2022-05-15 ENCOUNTER — Ambulatory Visit: Payer: Medicare HMO | Admitting: Obstetrics and Gynecology

## 2022-05-15 VITALS — BP 100/64 | Ht 63.5 in | Wt 114.0 lb

## 2022-05-15 DIAGNOSIS — N952 Postmenopausal atrophic vaginitis: Secondary | ICD-10-CM

## 2022-05-15 DIAGNOSIS — B3731 Acute candidiasis of vulva and vagina: Secondary | ICD-10-CM

## 2022-05-15 LAB — POCT WET PREP WITH KOH
Clue Cells Wet Prep HPF POC: NEGATIVE
KOH Prep POC: NEGATIVE
Trichomonas, UA: NEGATIVE
Yeast Wet Prep HPF POC: POSITIVE

## 2022-05-15 MED ORDER — FLUCONAZOLE 150 MG PO TABS: 150.0000 mg | ORAL_TABLET | Freq: Once | ORAL | 0 refills | Status: AC

## 2022-05-15 MED ORDER — ESTRADIOL 0.1 MG/GM VA CREA: TOPICAL_CREAM | VAGINAL | 0 refills | Status: DC

## 2022-05-15 NOTE — Progress Notes (Signed)
Kirk Ruths, MD   Chief Complaint  Patient presents with   Vaginal Discharge    Irritation, external irritation    HPI:      [name] is a 72 y.o. [phone] whose LMP was No LMP recorded. Patient is postmenopausal., presents today for increased vag d/c with irritation/itching/dysuria for 2 wks. Went to UC for dysuria, which was initial sx. Had normal UA, treated with doxy (had issue with finger too). No sx relief. No meds to treat vag sx. No new soaps/detergents. She is sexually active with dyspareunia since sx started. Uses vag ERT twice wkly with sx relief.  Has annual 2/24, needs Rx RF estrace till then. Goes to [facility]   Patient Active Problem List   Diagnosis Date Noted   BRCA negative 06/09/2017   Allergic rhinitis 08/06/2013   Hyperlipidemia 08/06/2013   Menopausal syndrome (hot flushes) 08/06/2013    Past Surgical History:  Procedure Laterality Date   APPENDECTOMY     CARPAL TUNNEL RELEASE  2011   CATARACT EXTRACTION W/PHACO Right 11/18/2016   Procedure: CATARACT EXTRACTION PHACO AND INTRAOCULAR LENS PLACEMENT ([place]);  Surgeon: Birder Robson, MD;  Location: [facility] ORS;  Service: Ophthalmology;  Laterality: Right;  [place] 00:30.3 AP% 19.8 CDE 6.01 Fluid Pack lot # [id number]   CESAREAN SECTION  1981   ctr     EYE SURGERY     TUBAL LIGATION  1991    Family History  Problem Relation Age of Onset   Heart failure Maternal Grandfather    Heart failure Paternal Grandmother    Breast cancer Paternal Aunt 52    Social History   Socioeconomic History   Marital status: Married    Spouse name: Not on file   Number of children: Not on file   Years of education: Not on file   Highest education level: Not on file  Occupational History   Not on file  Tobacco Use   Smoking status: Never   Smokeless tobacco: Never  Vaping Use   Vaping Use: Never used  Substance and Sexual Activity   Alcohol use: No   Drug use: No   Sexual activity: Yes     Partners: Male    Birth control/protection: Post-menopausal  Other Topics Concern   Not on file  Social History Narrative   Not on file   Social Determinants of Health   Financial Resource Strain: Not on file  Food Insecurity: Not on file  Transportation Needs: Not on file  Physical Activity: Not on file  Stress: Not on file  Social Connections: Not on file  Intimate Partner Violence: Not on file    Outpatient Medications Prior to Visit  Medication Sig Dispense Refill   Fluticasone Propionate (FLONASE NA) Place into the nose.     omega-3 acid ethyl esters (LOVAZA) 1 g capsule Take by mouth 2 (two) times daily.     XIIDRA 5 % SOLN      estradiol (ESTRACE) 0.1 MG/GM vaginal cream USE 1 GM VAGINALLY TWICE A WEEK 42.5 g 3   No facility-administered medications prior to visit.      ROS:  Review of Systems  Constitutional:  Negative for fever.  Gastrointestinal:  Negative for blood in stool, constipation, diarrhea, nausea and vomiting.  Genitourinary:  Positive for vaginal discharge and vaginal pain. Negative for dyspareunia, dysuria, flank pain, frequency, hematuria, urgency and vaginal bleeding.  Musculoskeletal:  Negative for back pain.  Skin:  Negative for rash.   BREAST:  No symptoms   OBJECTIVE:   Vitals:  BP 100/64   Ht 5' 3.5" (1.613 m)   Wt 114 lb (51.7 kg)   BMI 19.88 kg/m   Physical Exam Vitals reviewed.  Constitutional:      Appearance: She is well-developed.  Pulmonary:     Effort: Pulmonary effort is normal.  Genitourinary:    General: Normal vulva.     Pubic Area: No rash.      Labia:        Right: Rash and tenderness present. No lesion.        Left: Rash and tenderness present. No lesion.      Vagina: Vaginal discharge present. No erythema or tenderness.     Cervix: Normal.     Uterus: Normal. Not enlarged and not tender.      Adnexa: Right adnexa normal and left adnexa normal.       Right: No mass or tenderness.         Left: No mass or  tenderness.         Comments: BILAT LABIA MAJORA AND MINORA WITH ERYTHEMA AND SWELLING, TENDER TO TOUCH Musculoskeletal:        General: Normal range of motion.     Cervical back: Normal range of motion.  Skin:    General: Skin is warm and dry.  Neurological:     General: No focal deficit present.     Mental Status: She is alert and oriented to person, place, and time.  Psychiatric:        Mood and Affect: Mood normal.        Behavior: Behavior normal.        Thought Content: Thought content normal.        Judgment: Judgment normal.     Results: Results for orders placed or performed in visit on 05/15/22 (from the past 24 hour(s))  POCT Wet Prep with KOH     Status: Normal   Collection Time: 05/15/22  5:04 PM  Result Value Ref Range   Trichomonas, UA Negative    Clue Cells Wet Prep HPF POC neg    Epithelial Wet Prep HPF POC     Yeast Wet Prep HPF POC pos    Bacteria Wet Prep HPF POC     RBC Wet Prep HPF POC     WBC Wet Prep HPF POC     KOH Prep POC Negative Negative     Assessment/Plan: Candidal vaginitis - Plan: POCT Wet Prep with KOH, fluconazole (DIFLUCAN) 150 MG tablet; pos sx, exam, and wet prep. Rx diflucan to [facility]C hydrocortisone crm. F/u prn.   Vaginal atrophy - Plan: estradiol (ESTRACE) 0.1 MG/GM vaginal cream; Rx [facility] to [facility]    Meds ordered this encounter  Medications   fluconazole (DIFLUCAN) 150 MG tablet    Sig: Take 1 tablet (150 mg total) by mouth once for 1 dose. May repeat in 3 days if still having symptoms    Dispense:  2 tablet    Refill:  0    Order Specific Question:   Supervising Provider    Answer:   Rubie Maid [[id number]]   estradiol (ESTRACE) 0.1 MG/GM vaginal cream    Sig: USE 1 GM VAGINALLY TWICE A WEEK    Dispense:  42.5 g    Refill:  0    Order Specific Question:   Supervising Provider    Answer:   Rubie Maid [[id number]]      Return if symptoms worsen or  fail to improve.  Adrienna Karis B. Faydra Korman, PA-C 05/15/2022 5:07  PM

## 2022-06-16 NOTE — Progress Notes (Unsigned)
PCP: Kirk Ruths, MD   No chief complaint on file.   HPI:      [name] is a 72 y.o. [id number] whose LMP was No LMP recorded. Patient is postmenopausal., presents today for her annual examination.  Her menses are absent due to menopause Treatd for east vag 1/24  Sex activity: single partner, contraception - post menopausal status. She does have vaginal dryness and uses vag ERT twice wkly.  Last Pap: 09/14/20  Results were: no abnormalities /neg HPV DNA.  Hx of STDs: {STD hx:14358}  Last mammogram: 10/10/20  Results were: normal--routine follow-up in 12 months There is a FH of breast cancer in her pat aunt, pt is BRCA neg 2014. Doesn't qualify for updated testing with medicare guidelines. There is no FH of ovarian cancer. The patient {does:18564} do self-breast exams.  Colonoscopy: {hx:15363} at [facility] Repeat due after 10*** years.   Tobacco use: {tob:20664} Alcohol use: {Alcohol:11675} No drug use Exercise: {exercise:31265}  She {does:18564} get adequate calcium and Vitamin D in her diet.  Labs with PCP.   Patient Active Problem List   Diagnosis Date Noted   BRCA negative 06/09/2017   Allergic rhinitis 08/06/2013   Hyperlipidemia 08/06/2013   Menopausal syndrome (hot flushes) 08/06/2013    Past Surgical History:  Procedure Laterality Date   APPENDECTOMY     CARPAL TUNNEL RELEASE  2011   CATARACT EXTRACTION W/PHACO Right 11/18/2016   Procedure: CATARACT EXTRACTION PHACO AND INTRAOCULAR LENS PLACEMENT ([place]);  Surgeon: Birder Robson, MD;  Location: [facility] ORS;  Service: Ophthalmology;  Laterality: Right;  [place] 00:30.3 AP% 19.8 CDE 6.01 Fluid Pack lot # [id number]   CESAREAN SECTION  1981   ctr     EYE SURGERY     TUBAL LIGATION  1991    Family History  Problem Relation Age of Onset   Heart failure Maternal Grandfather    Heart failure Paternal Grandmother    Breast cancer Paternal Aunt 49    Social History   Socioeconomic History   Marital  status: Married    Spouse name: Not on file   Number of children: Not on file   Years of education: Not on file   Highest education level: Not on file  Occupational History   Not on file  Tobacco Use   Smoking status: Never   Smokeless tobacco: Never  Vaping Use   Vaping Use: Never used  Substance and Sexual Activity   Alcohol use: No   Drug use: No   Sexual activity: Yes    Partners: Male    Birth control/protection: Post-menopausal  Other Topics Concern   Not on file  Social History Narrative   Not on file   Social Determinants of Health   Financial Resource Strain: Not on file  Food Insecurity: Not on file  Transportation Needs: Not on file  Physical Activity: Not on file  Stress: Not on file  Social Connections: Not on file  Intimate Partner Violence: Not on file     Current Outpatient Medications:    estradiol (ESTRACE) 0.1 MG/GM vaginal cream, USE 1 GM VAGINALLY TWICE A WEEK, Disp: 42.5 g, Rfl: 0   Fluticasone Propionate (FLONASE NA), Place into the nose., Disp: , Rfl:    omega-3 acid ethyl esters (LOVAZA) 1 g capsule, Take by mouth 2 (two) times daily., Disp: , Rfl:    XIIDRA 5 % SOLN, , Disp: , Rfl:      ROS:  Review of Systems BREAST:  No symptoms    Objective: There were no vitals taken for this visit.   OBGyn Exam  Results: No results found for this or any previous visit (from the past 24 hour(s)).  Assessment/Plan:  No diagnosis found.   No orders of the defined types were placed in this encounter.           GYN counsel {counseling: 16159}    F/U  No follow-ups on file.  Aailyah Dunbar B. Adelyn Roscher, PA-C 06/16/2022 12:16 PM

## 2022-06-17 ENCOUNTER — Encounter: Payer: Self-pay | Admitting: Obstetrics and Gynecology

## 2022-06-17 ENCOUNTER — Ambulatory Visit (INDEPENDENT_AMBULATORY_CARE_PROVIDER_SITE_OTHER): Payer: Medicare HMO | Admitting: Obstetrics and Gynecology

## 2022-06-17 VITALS — BP 114/64 | Ht 63.5 in | Wt 114.0 lb

## 2022-06-17 DIAGNOSIS — N952 Postmenopausal atrophic vaginitis: Secondary | ICD-10-CM

## 2022-06-17 DIAGNOSIS — Z1211 Encounter for screening for malignant neoplasm of colon: Secondary | ICD-10-CM

## 2022-06-17 DIAGNOSIS — Z01419 Encounter for gynecological examination (general) (routine) without abnormal findings: Secondary | ICD-10-CM

## 2022-06-17 DIAGNOSIS — Z1231 Encounter for screening mammogram for malignant neoplasm of breast: Secondary | ICD-10-CM

## 2022-06-17 MED ORDER — ESTRADIOL 0.1 MG/GM VA CREA: TOPICAL_CREAM | VAGINAL | 1 refills | Status: DC

## 2022-06-17 NOTE — Patient Instructions (Addendum)
I value your feedback and you entrusting us with your care. If you get a [facility] patient survey, I would appreciate you taking the time to let us know about your experience today. Thank you!  [facility]: [phone]      

## 2022-08-30 IMAGING — MR MR HEAD WO/W CM
14 series · 48 of 48 positions shown · IV contrast (gadavist)
Comparison: No pertinent prior exams available for comparison.

CLINICAL DATA: Provided history: Right homonymous hemianopsia.
Additional history provided by scanning technologist: Patient
reports right eye vision loss and blurriness over the last month.
History of cataract and macular hole surgery.

EXAM:
MRI HEAD AND ORBITS WITHOUT AND WITH CONTRAST
TECHNIQUE: Multiplanar, multiecho pulse sequences of the brain and surrounding
structures were obtained without and with intravenous contrast.
Multiplanar, multiecho pulse sequences of the orbits and surrounding
structures were obtained including fat saturation techniques, before
and after intravenous contrast administration.
CONTRAST:  5mL GADAVIST GADOBUTROL 1 MMOL/ML IV SOLN

[Series 5: ax dwi_tracew · axial · 3.0mm · 0.65mm/px · z∈[-115,+23]mm · 2 of 44 slices shown]
[im 1/44]
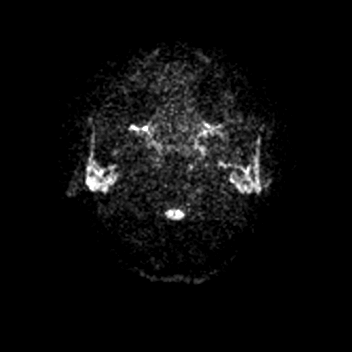
[im 44/44]
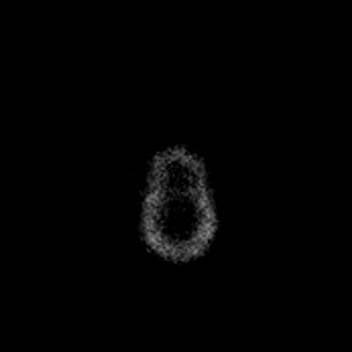

[Series 6: ax dwi_adc · axial · 3.0mm · 0.65mm/px · z∈[-115,+23]mm · 2 of 44 slices shown]
[im 1/44]
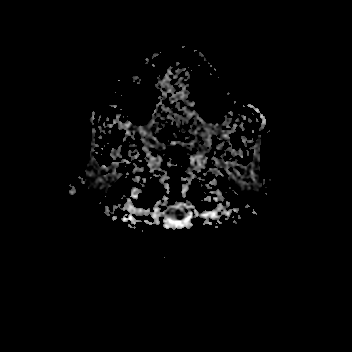
[im 44/44]
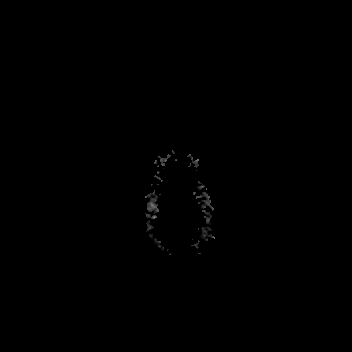

[Series 7: cor dwi_tracew · coronal · 5.0mm · 1.80mm/px · 2 of 34 slices shown]
[im 1/34]
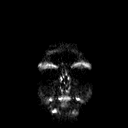
[im 34/34]
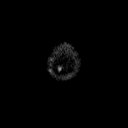

[Series 8: cor dwi_adc · coronal · 5.0mm · 1.80mm/px · 2 of 34 slices shown]
[im 1/34]
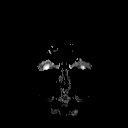
[im 34/34]
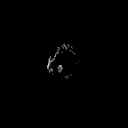

[Series 9: T1 · sagittal · 5.0mm · 0.62mm/px · 1 of 21 slices shown (1 of 2)]
[im 1/21]
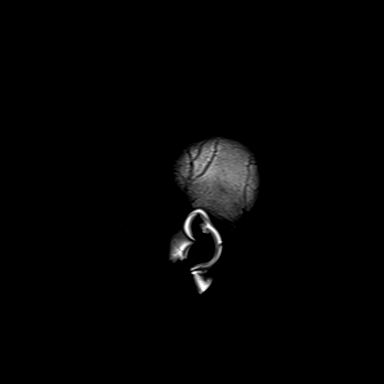

[Series 10: T2 · axial · 5.0mm · 0.53mm/px · z∈[-115,+25]mm · 2 of 25 slices shown]
[im 1/25]
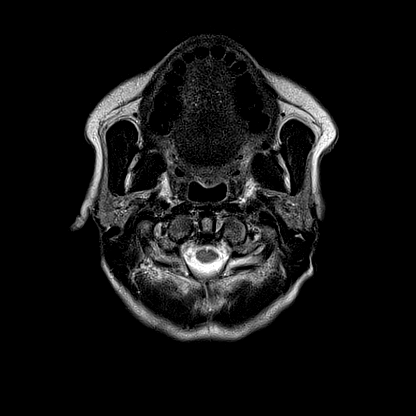
[im 25/25]
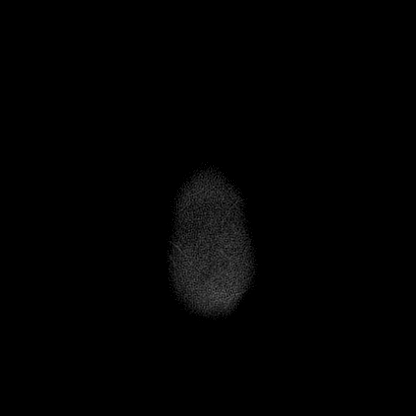

[Series 11: mag_images · axial · 3.0mm · 0.90mm/px · z∈[-133,+40]mm · 4 of 60 slices shown]
[im 1/60]
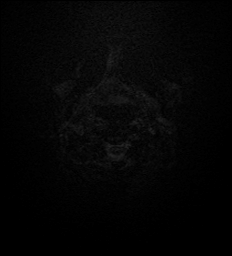
[im 20/60]
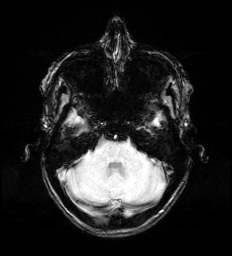
[im 40/60]
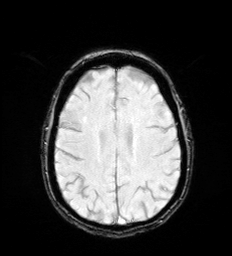
[im 60/60]
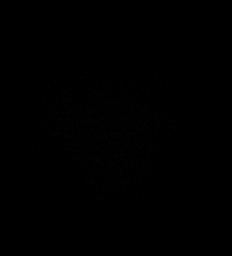

[Series 12: pha_images · axial · 3.0mm · 0.90mm/px · z∈[-133,+40]mm · 4 of 58 slices shown]
[im 1/58]
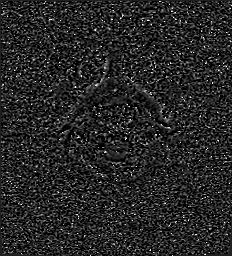
[im 20/58]
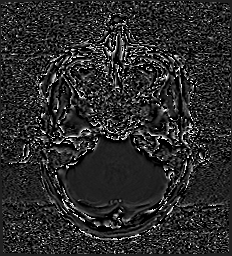
[im 39/58]
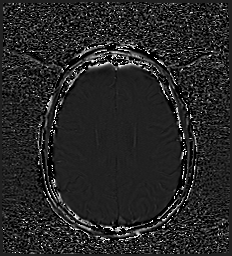
[im 58/58]
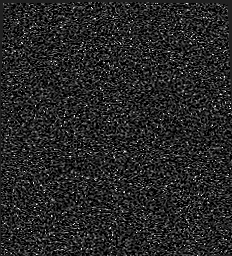

[Series 13: swi_images · axial · 3.0mm · 0.90mm/px · z∈[-133,+37]mm · 4 of 59 slices shown]
[im 1/59]
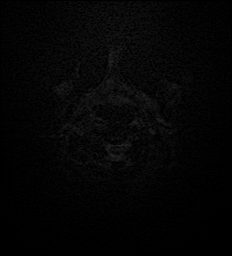
[im 20/59]
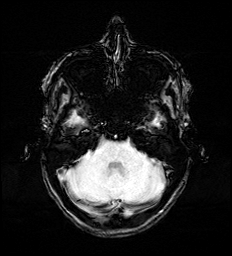
[im 39/59]
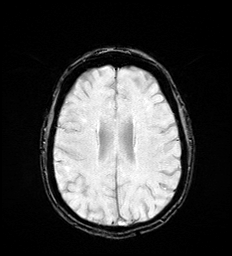
[im 59/59]
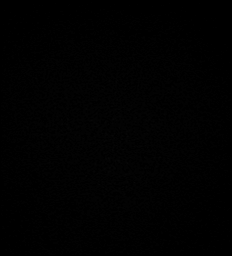

[Series 15: FLAIR · axial · 3.0mm · 0.53mm/px · z∈[-124,+34]mm · 3 of 55 slices shown]
[im 1/55]
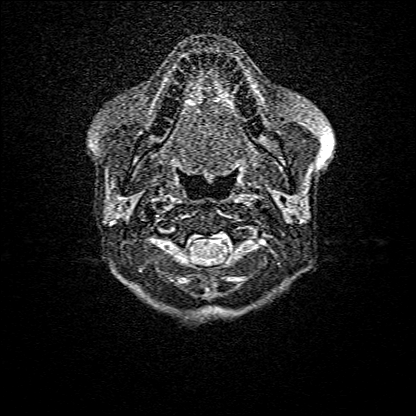
[im 28/55]
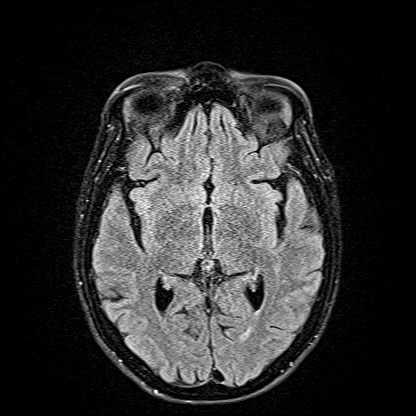
[im 55/55]
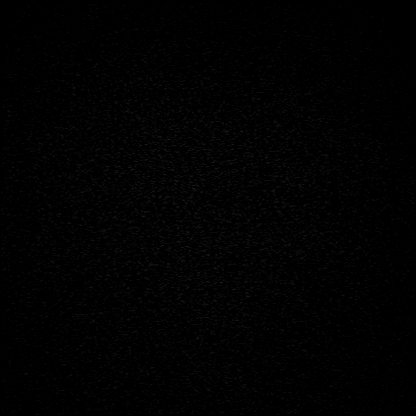

[Series 16: T1 · axial · 1.0mm · 0.98mm/px · z∈[-118,+21]mm · 9 of 144 slices shown (2 of 2)]
[im 1/144]
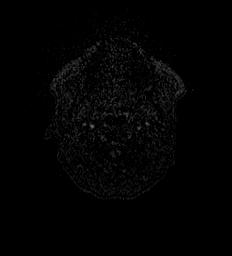
[im 18/144]
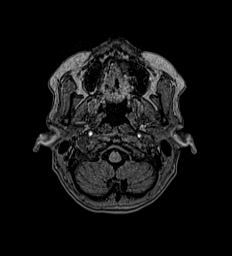
[im 36/144]
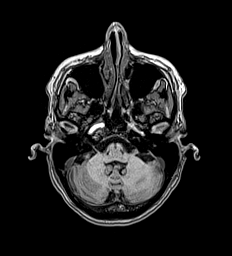
[im 54/144]
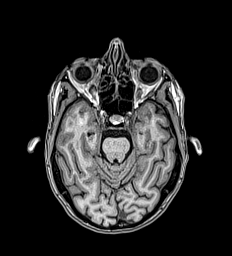
[im 72/144]
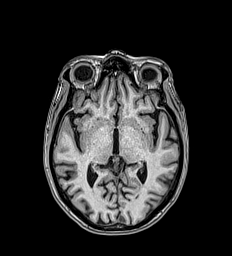
[im 90/144]
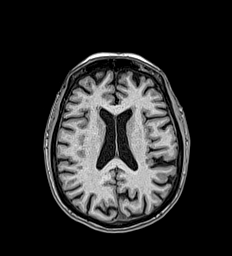
[im 108/144]
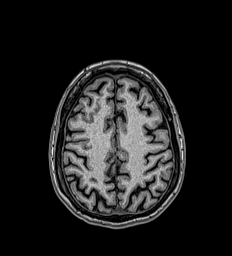
[im 126/144]
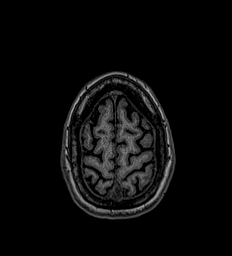
[im 144/144]
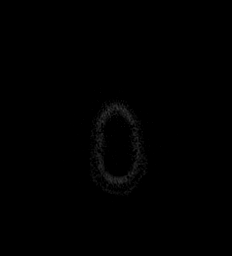

[Series 17: T2 post-contrast · coronal · 5.0mm · 0.57mm/px · 2 of 27 slices shown]
[im 1/27]
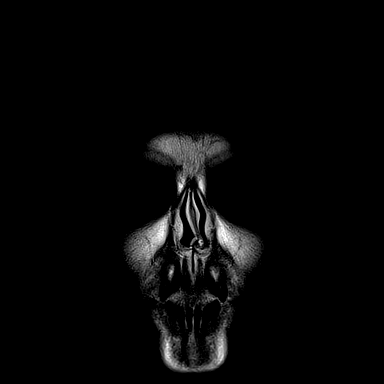
[im 27/27]
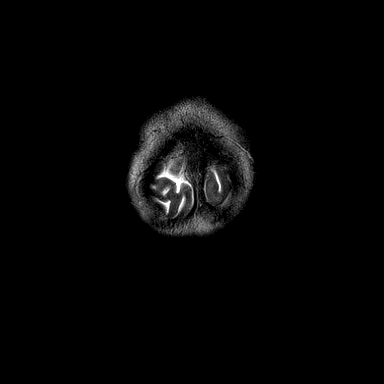

[Series 18: T1 post-contrast · axial · 1.0mm · 0.98mm/px · z∈[-118,+21]mm · 9 of 144 slices shown (1 of 2)]
[im 1/144]
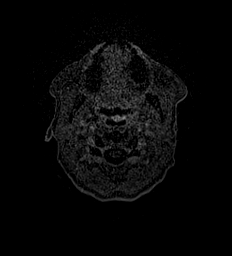
[im 18/144]
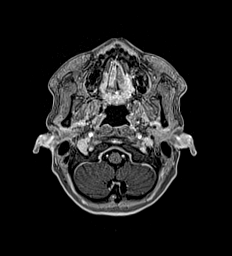
[im 36/144]
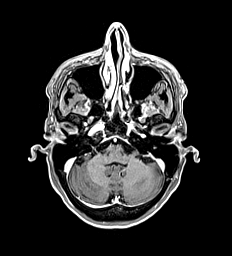
[im 54/144]
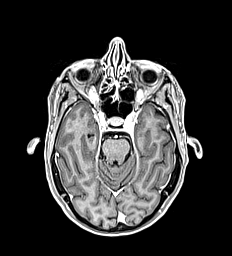
[im 72/144]
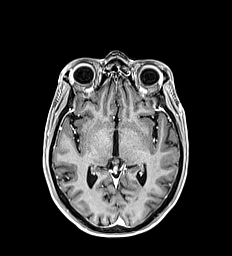
[im 90/144]
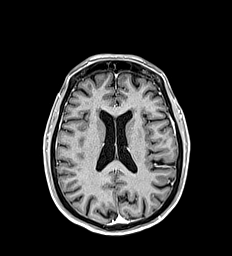
[im 108/144]
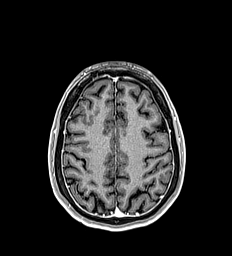
[im 126/144]
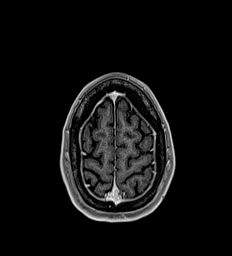
[im 144/144]
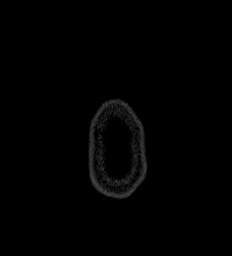

[Series 19: T1 post-contrast · coronal · 5.0mm · 0.57mm/px · 2 of 27 slices shown (2 of 2)]
[im 1/27]
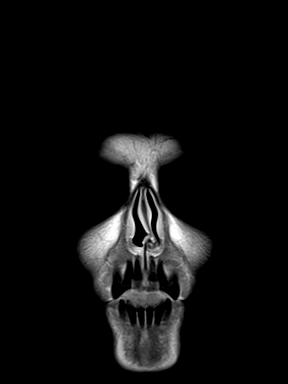
[im 27/27]
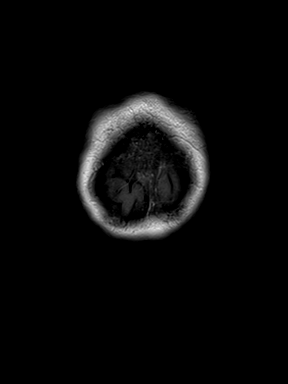

[48 of 48 positions shown; findings below may reference images not displayed]

FINDINGS: MRI HEAD FINDINGS

Brain:

No age advanced or lobar predominant parenchymal atrophy.

Multifocal T2 FLAIR hyperintense signal abnormality within the
cerebral white matter, nonspecific but compatible with mild chronic
small vessel ischemic disease.

There is no acute infarct.

No evidence of an intracranial mass.

No chronic intracranial blood products.

No extra-axial fluid collection.

No midline shift.

No pathologic intracranial enhancement identified.

Vascular: Maintained flow voids within the proximal large arterial
vessels.

Skull and upper cervical spine: No focal suspicious marrow lesion.
Incompletely assessed cervical spondylosis.

Other: Trace fluid within right mastoid air cells.

MRI ORBITS FINDINGS

Orbits: Prior bilateral ocular lens replacement. The globes are
normal in size and contour. The extraocular muscles, optic nerve
sheath complexes and lacrimal glands are symmetric and unremarkable.
No intraorbital mass or pathologic intraorbital enhancement
identified.

Visualized sinuses: Mild mucosal thickening within the bilateral
ethmoid sinuses.

Soft tissues: The visualized maxillofacial and upper neck soft
tissues are unremarkable.
IMPRESSION: MRI brain:

1. No evidence of acute intracranial abnormality.
2. Mild chronic small vessel ischemic changes within the cerebral
white matter.

MRI orbits:

1. Unremarkable MRI appearance of the orbits.
2. Mild mucosal thickening within the bilateral ethmoid sinuses.

## 2022-09-02 ENCOUNTER — Ambulatory Visit
Admission: RE | Admit: 2022-09-02 | Discharge: 2022-09-02 | Disposition: A | Discharge status: Home / Self Care | Payer: Medicare HMO | Source: Ambulatory Visit | Attending: Internal Medicine | Admitting: Internal Medicine

## 2022-09-02 DIAGNOSIS — Z1231 Encounter for screening mammogram for malignant neoplasm of breast: Secondary | ICD-10-CM | POA: Diagnosis not present

## 2022-09-24 ENCOUNTER — Other Ambulatory Visit: Payer: Self-pay | Admitting: Student

## 2022-09-24 DIAGNOSIS — J3801 Paralysis of vocal cords and larynx, unilateral: Secondary | ICD-10-CM

## 2022-09-24 DIAGNOSIS — R490 Dysphonia: Secondary | ICD-10-CM

## 2022-09-30 ENCOUNTER — Ambulatory Visit
Admission: RE | Admit: 2022-09-30 | Discharge: 2022-09-30 | Disposition: A | Discharge status: Home / Self Care | Payer: Medicare HMO | Source: Ambulatory Visit | Attending: Student | Admitting: Student

## 2022-09-30 DIAGNOSIS — J3801 Paralysis of vocal cords and larynx, unilateral: Secondary | ICD-10-CM

## 2022-09-30 DIAGNOSIS — R490 Dysphonia: Secondary | ICD-10-CM

## 2022-09-30 MED ORDER — IOPAMIDOL (ISOVUE-300) INJECTION 61%
75.0000 mL | Freq: Once | INTRAVENOUS | Status: AC | PRN
  Administered 2022-09-30: 75 mL via INTRAVENOUS

## 2022-10-09 ENCOUNTER — Other Ambulatory Visit: Payer: Self-pay | Admitting: Student

## 2022-10-09 DIAGNOSIS — R490 Dysphonia: Secondary | ICD-10-CM

## 2022-10-17 ENCOUNTER — Ambulatory Visit
Admission: RE | Admit: 2022-10-17 | Discharge: 2022-10-17 | Disposition: A | Discharge status: Home / Self Care | Payer: Medicare HMO | Source: Ambulatory Visit | Attending: Student | Admitting: Student

## 2022-10-17 DIAGNOSIS — R490 Dysphonia: Secondary | ICD-10-CM | POA: Diagnosis not present

## 2022-10-17 LAB — POCT I-STAT CREATININE: Creatinine, Ser: 1 mg/dL (ref 0.44–1.00)

## 2022-10-17 MED ORDER — IOHEXOL 300 MG/ML  SOLN
75.0000 mL | Freq: Once | INTRAMUSCULAR | Status: AC | PRN
  Administered 2022-10-17: 75 mL via INTRAVENOUS

## 2023-04-17 ENCOUNTER — Other Ambulatory Visit: Payer: Self-pay | Admitting: Obstetrics and Gynecology

## 2023-04-17 DIAGNOSIS — N952 Postmenopausal atrophic vaginitis: Secondary | ICD-10-CM

## 2023-06-09 ENCOUNTER — Ambulatory Visit: Admitting: Obstetrics and Gynecology

## 2023-06-27 NOTE — Progress Notes (Unsigned)
 PCP: Lauro Regulus, MD   No chief complaint on file.   HPI:      [name] is a 73 y.o. [id number] whose LMP was No LMP recorded. Patient is postmenopausal., presents today for her annual examination.  Her menses are absent due to menopause, no PMB, no vasomotor sx.   See 1/24 for yeast vag and treated with diflucan x 2 with sx relief. Pt doing well.   Sex activity: single partner, contraception - post menopausal status. She does have vaginal dryness and uses vag ERT twice wkly. Hasn't tried lubricants for decreased lubrication.   Last Pap: 09/14/20  Results were: no abnormalities /neg HPV DNA.   Last mammogram: 09/02/22 Results were: normal--routine follow-up in 12 months There is a FH of breast cancer in her pat aunt, pt is BRCA neg 2014. Doesn't qualify for updated testing with medicare guidelines. There is no FH of ovarian cancer. The patient does not do self-breast exams.  Colonoscopy: 5/21 at [facility], Repeat due after 5 years per pt; FH of polyps.   Tobacco use: The patient denies current or previous tobacco use. Alcohol use: none No drug use Exercise: moderately active  She does get adequate calcium but not Vitamin D in her diet.  Labs with PCP.   Patient Active Problem List   Diagnosis Date Noted   BRCA negative 06/09/2017   Allergic rhinitis 08/06/2013   Hyperlipidemia 08/06/2013   Menopausal syndrome (hot flushes) 08/06/2013    Past Surgical History:  Procedure Laterality Date   APPENDECTOMY     CARPAL TUNNEL RELEASE  2011   CATARACT EXTRACTION W/PHACO Right 11/18/2016   Procedure: CATARACT EXTRACTION PHACO AND INTRAOCULAR LENS PLACEMENT ([facility]);  Surgeon: Galen Manila, MD;  Location: [facility] ORS;  Service: Ophthalmology;  Laterality: Right;  [place] 00:30.3 AP% 19.8 CDE 6.01 Fluid Pack lot # [id number]   CESAREAN SECTION  1981   ctr     EYE SURGERY     TUBAL LIGATION  1991    Family History  Problem Relation Age of Onset   Breast cancer Paternal  Aunt 30   Heart failure Maternal Grandfather    Heart failure Paternal Grandmother     Social History   Socioeconomic History   Marital status: Married    Spouse name: Not on file   Number of children: Not on file   Years of education: Not on file   Highest education level: Not on file  Occupational History   Not on file  Tobacco Use   Smoking status: Never   Smokeless tobacco: Never  Vaping Use   Vaping status: Never Used  Substance and Sexual Activity   Alcohol use: No   Drug use: No   Sexual activity: Yes    Partners: Male    Birth control/protection: Post-menopausal  Other Topics Concern   Not on file  Social History Narrative   Not on file   Social Drivers of Health   Financial Resource Strain: Low Risk  (04/09/2023)   Received from [facility]   Overall Financial Resource Strain (CARDIA)    Difficulty of Paying Living Expenses: Not hard at all  Food Insecurity: No Food Insecurity (04/09/2023)   Received from [facility]   Hunger Vital Sign    Worried About Running Out of Food in the Last Year: Never true    Ran Out of Food in the Last Year: Never true  Transportation Needs: No Transportation Needs (04/09/2023)  Received from [facility] - Transportation    In the past 12 months, has lack of transportation kept you from medical appointments or from getting medications?: No    Lack of Transportation (Non-Medical): No  Physical Activity: Not on file  Stress: Not on file  Social Connections: Not on file  Intimate Partner Violence: Not on file     Current Outpatient Medications:    estradiol (ESTRACE) 0.1 MG/GM vaginal cream, USE 1 GRAM VAGINALLY TWICE A WEEK, Disp: 42.5 g, Rfl: 0   Fluticasone Propionate (FLONASE NA), Place into the nose., Disp: , Rfl:    MIEBO 1.338 GM/ML SOLN, Apply to eye., Disp: , Rfl:    omega-3 acid ethyl esters (LOVAZA) 1 g capsule, Take by mouth 2 (two) times daily.,  Disp: , Rfl:    XIIDRA 5 % SOLN, , Disp: , Rfl:      ROS:  Review of Systems  Constitutional:  Negative for fatigue, fever and unexpected weight change.  Respiratory:  Negative for cough, shortness of breath and wheezing.   Cardiovascular:  Negative for chest pain, palpitations and leg swelling.  Gastrointestinal:  Negative for blood in stool, constipation, diarrhea, nausea and vomiting.  Endocrine: Negative for cold intolerance, heat intolerance and polyuria.  Genitourinary:  Negative for dyspareunia, dysuria, flank pain, frequency, genital sores, hematuria, menstrual problem, pelvic pain, urgency, vaginal bleeding, vaginal discharge and vaginal pain.  Musculoskeletal:  Negative for back pain, joint swelling and myalgias.  Skin:  Negative for rash.  Neurological:  Negative for dizziness, syncope, light-headedness, numbness and headaches.  Hematological:  Negative for adenopathy.  Psychiatric/Behavioral:  Negative for agitation, confusion, sleep disturbance and suicidal ideas. The patient is not nervous/anxious.    BREAST: No symptoms    Objective: There were no vitals taken for this visit.   Physical Exam Constitutional:      Appearance: She is well-developed.  Genitourinary:     Vulva normal.     Right Labia: No rash, tenderness or lesions.    Left Labia: No tenderness, lesions or rash.    No vaginal discharge, erythema or tenderness.     Mild vaginal atrophy present.     Right Adnexa: not tender and no mass present.    Left Adnexa: not tender and no mass present.    No cervical friability or polyp.     Uterus is not enlarged or tender.  Breasts:    Right: No mass, nipple discharge, skin change or tenderness.     Left: No mass, nipple discharge, skin change or tenderness.  Neck:     Thyroid: No thyromegaly.  Cardiovascular:     Rate and Rhythm: Normal rate and regular rhythm.     Heart sounds: Normal heart sounds. No murmur heard. Pulmonary:     Effort: Pulmonary  effort is normal.     Breath sounds: Normal breath sounds.  Abdominal:     Palpations: Abdomen is soft.     Tenderness: There is no abdominal tenderness. There is no guarding or rebound.  Musculoskeletal:        General: Normal range of motion.     Cervical back: Normal range of motion.  Lymphadenopathy:     Cervical: No cervical adenopathy.  Neurological:     General: No focal deficit present.     Mental Status: She is alert and oriented to person, place, and time.     Cranial Nerves: No cranial nerve deficit.  Skin:    General: Skin  is warm and dry.  Psychiatric:        Mood and Affect: Mood normal.        Behavior: Behavior normal.        Thought Content: Thought content normal.        Judgment: Judgment normal.  Vitals reviewed.    Assessment/Plan:  Encounter for annual routine gynecological examination  Encounter for screening mammogram for malignant neoplasm of breast - Plan: MM 3D SCREEN BREAST BILATERAL; pt to schedule mammo  Vaginal atrophy - Plan: estradiol (ESTRACE) 0.1 MG/GM vaginal cream; Rx RF. Doing well, add lubricant/coconut oil   No orders of the defined types were placed in this encounter.           GYN counsel breast self exam, mammography screening, menopause, adequate intake of calcium and vitamin D, diet and exercise    F/U  No follow-ups on file.  Quill Grinder B. Hiroki Wint, PA-C 06/27/2023 12:58 PM

## 2023-06-29 ENCOUNTER — Ambulatory Visit (INDEPENDENT_AMBULATORY_CARE_PROVIDER_SITE_OTHER): Payer: Medicare Other | Admitting: Obstetrics and Gynecology

## 2023-06-29 ENCOUNTER — Encounter: Payer: Self-pay | Admitting: Obstetrics and Gynecology

## 2023-06-29 VITALS — BP 107/53 | HR 53 | Ht 63.5 in | Wt 115.0 lb

## 2023-06-29 DIAGNOSIS — Z01411 Encounter for gynecological examination (general) (routine) with abnormal findings: Secondary | ICD-10-CM

## 2023-06-29 DIAGNOSIS — N952 Postmenopausal atrophic vaginitis: Secondary | ICD-10-CM

## 2023-06-29 DIAGNOSIS — R350 Frequency of micturition: Secondary | ICD-10-CM

## 2023-06-29 DIAGNOSIS — Z1231 Encounter for screening mammogram for malignant neoplasm of breast: Secondary | ICD-10-CM

## 2023-06-29 DIAGNOSIS — Z13820 Encounter for screening for osteoporosis: Secondary | ICD-10-CM

## 2023-06-29 DIAGNOSIS — Z01419 Encounter for gynecological examination (general) (routine) without abnormal findings: Secondary | ICD-10-CM

## 2023-06-29 LAB — POCT URINALYSIS DIPSTICK
Bilirubin, UA: NEGATIVE
Blood, UA: NEGATIVE
Glucose, UA: NEGATIVE
Ketones, UA: NEGATIVE
Leukocytes, UA: NEGATIVE
Nitrite, UA: NEGATIVE
Protein, UA: NEGATIVE
Spec Grav, UA: 1.01 (ref 1.010–1.025)
Urobilinogen, UA: 0.2 U/dL
pH, UA: 7 (ref 5.0–8.0)

## 2023-06-29 MED ORDER — ESTRADIOL 0.1 MG/GM VA CREA: TOPICAL_CREAM | VAGINAL | 2 refills | Status: AC

## 2023-06-29 NOTE — Patient Instructions (Addendum)
 I value your feedback and you entrusting [place] with your care. If you get a [facility] patient survey, I would appreciate you taking the time to let us know about your experience today. Thank you!  [facility] ([place])--[phone]

## 2023-07-06 ENCOUNTER — Telehealth: Payer: Self-pay

## 2023-07-06 NOTE — Telephone Encounter (Signed)
 See if pt can come in at 4:15 today or work-in slot tomorrow. Tell her to wear mask. Or she can f/u with PCP since we can't eval the URI/fever sx.

## 2023-07-06 NOTE — Telephone Encounter (Signed)
 Patient states she was seen last Tuesday. She states on Saturday morning, she noticed blood in her urine and she had urinary frequency (felt like she had to use the bathroom every 5 minutes). She also had chills w/o known fever for the past two days. States she was shaking uncontrollably. She also has head congestion. I inquired if she has been tested for flu. She advised she received the flu shot. I advised she can still get the flu, her symptoms may not be as bad or last as long. She states she has been drinking a lot of water since Saturday morning. Today she feels like she is on fire down there. She denies any itching or discharge.

## 2023-07-06 NOTE — Telephone Encounter (Signed)
 Pt aware. Transferred to Elizabeth [name] to schedule appt.

## 2023-07-07 ENCOUNTER — Ambulatory Visit

## 2023-07-07 DIAGNOSIS — R399 Unspecified symptoms and signs involving the genitourinary system: Secondary | ICD-10-CM

## 2023-07-07 LAB — POCT URINALYSIS DIPSTICK
Appearance: ABNORMAL
Bilirubin, UA: NEGATIVE
Glucose, UA: NEGATIVE
Ketones, UA: NEGATIVE
Nitrite, UA: POSITIVE
Odor: NORMAL
Protein, UA: NEGATIVE
Spec Grav, UA: 1.02 (ref 1.010–1.025)
Urobilinogen, UA: 0.2 U/dL
pH, UA: 6 (ref 5.0–8.0)

## 2023-07-07 MED ORDER — SULFAMETHOXAZOLE-TRIMETHOPRIM 800-160 MG PO TABS: 1.0000 | ORAL_TABLET | Freq: Two times a day (BID) | ORAL | 0 refills | Status: AC

## 2023-07-07 NOTE — Progress Notes (Signed)
    NURSE VISIT NOTE  Subjective:    Patient ID: Elizabeth [name], female    DOB: 03-Jul-1950, 73 y.o.   MRN: [id number]       HPI  Patient is a 73 y.o. [id number] female who presents for hematuria, urinary frequency, and genital irritation for 4 days.  Patient denies dysuria, urinary urgency, flank pain, abdominal pain, pelvic pain, and vaginal discharge.  Patient does not have a history of recurrent UTI.  Patient does not have a history of pyelonephritis.    Objective:      Lab Review  Results for orders placed or performed in visit on 07/07/23  POCT Urinalysis Dipstick  Result Value Ref Range   Color, UA amber    Clarity, UA cloudy    Glucose, UA Negative Negative   Bilirubin, UA Negative    Ketones, UA Negative    Spec Grav, UA 1.020 1.010 - 1.025   Blood, UA moderat    pH, UA 6.0 5.0 - 8.0   Protein, UA Negative Negative   Urobilinogen, UA 0.2 0.2 or 1.0 E.U./dL   Nitrite, UA Positive    Leukocytes, UA Small (1+) (A) Negative   Appearance abnormal    Odor normal     Assessment:   1. UTI symptoms      Plan:   Urine Culture Sent. Treatment  Bactrim DS 1 PO BID for 7 days. Maintain adequate hydration.  May use AZO OTC prn.  Follow up if symptoms worsen or fail to improve as anticipated, and as needed.    Rocco Serene, LPN

## 2023-07-07 NOTE — Addendum Note (Signed)
 Addended by: Kathlene Cote on: 07/07/2023 10:39 AM   Modules accepted: Orders

## 2023-07-07 NOTE — Patient Instructions (Signed)

## 2023-07-10 LAB — URINE CULTURE

## 2023-07-12 ENCOUNTER — Encounter: Payer: Self-pay | Admitting: Obstetrics and Gynecology

## 2023-09-03 ENCOUNTER — Ambulatory Visit
Admission: RE | Admit: 2023-09-03 | Discharge: 2023-09-03 | Disposition: A | Discharge status: Home / Self Care | Source: Ambulatory Visit | Attending: Obstetrics and Gynecology | Admitting: Obstetrics and Gynecology

## 2023-09-03 ENCOUNTER — Encounter: Payer: Self-pay | Admitting: Obstetrics and Gynecology

## 2023-09-03 DIAGNOSIS — Z1231 Encounter for screening mammogram for malignant neoplasm of breast: Secondary | ICD-10-CM | POA: Insufficient documentation

## 2023-09-03 DIAGNOSIS — Z13820 Encounter for screening for osteoporosis: Secondary | ICD-10-CM | POA: Insufficient documentation

## 2023-09-03 DIAGNOSIS — M810 Age-related osteoporosis without current pathological fracture: Secondary | ICD-10-CM | POA: Insufficient documentation

## 2023-09-03 DIAGNOSIS — Z780 Asymptomatic menopausal state: Secondary | ICD-10-CM | POA: Diagnosis not present

## 2023-09-06 ENCOUNTER — Encounter: Payer: Self-pay | Admitting: Obstetrics and Gynecology

## 2023-11-20 NOTE — Progress Notes (Signed)
 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 Optic neuropathy, no fhx,  iop borderline, oct shows thinning sup and inf od, inf os, hvf with inferonasal and cenrtral scotoma od, early central defect os- -continue with Dr. Bethany  ?high myopia prior to LASIK   Vf loss od June 2023- MRI performed, pt reports as nl  S/p macular hole os 2014  PVD od  Drusen ou  Pseudophakia ou s/p yag caps ou  S/p LASIK ou  Des- ats qid  Dermatochalasis ou  Salzman's nodules OU  Lamellar hole od  ERM ou  [facility]- shades, glv, mvi   Plan: Decrease vigamox  to 4x daily for 1 week then stop Continue all other drops  Keep annual appointment with comprehensive  The Chief Complaint, HPI, ROS, and PFSHx as documented were reviewed with the patient and updated as appropriate.  Franky Mace, MD

## 2023-11-23 ENCOUNTER — Other Ambulatory Visit: Payer: Self-pay

## 2023-11-23 ENCOUNTER — Emergency Department: Admission: EM | Admit: 2023-11-23 | Discharge: 2023-11-23 | Disposition: A | Discharge status: Home / Self Care

## 2023-11-23 DIAGNOSIS — T7840XA Allergy, unspecified, initial encounter: Secondary | ICD-10-CM | POA: Diagnosis present

## 2023-11-23 DIAGNOSIS — J3489 Other specified disorders of nose and nasal sinuses: Secondary | ICD-10-CM

## 2023-11-23 LAB — CBC WITH DIFFERENTIAL/PLATELET
Abs Immature Granulocytes: 0.04 K/uL (ref 0.00–0.07)
Basophils Absolute: 0 K/uL (ref 0.0–0.1)
Basophils Relative: 0 %
Eosinophils Absolute: 0 K/uL (ref 0.0–0.5)
Eosinophils Relative: 1 %
HCT: 41.3 % (ref 36.0–46.0)
Hemoglobin: 14.1 g/dL (ref 12.0–15.0)
Immature Granulocytes: 1 %
Lymphocytes Relative: 12 %
Lymphs Abs: 1 K/uL (ref 0.7–4.0)
MCH: 29.3 pg (ref 26.0–34.0)
MCHC: 34.1 g/dL (ref 30.0–36.0)
MCV: 85.7 fL (ref 80.0–100.0)
Monocytes Absolute: 0.1 K/uL (ref 0.1–1.0)
Monocytes Relative: 2 %
Neutro Abs: 7.2 K/uL (ref 1.7–7.7)
Neutrophils Relative %: 84 %
Platelets: 257 K/uL (ref 150–400)
RBC: 4.82 MIL/uL (ref 3.87–5.11)
RDW: 12.5 % (ref 11.5–15.5)
WBC: 8.4 K/uL (ref 4.0–10.5)
nRBC: 0 % (ref 0.0–0.2)

## 2023-11-23 LAB — COMPREHENSIVE METABOLIC PANEL WITH GFR
ALT: 16 U/L (ref 0–44)
AST: 25 U/L (ref 15–41)
Albumin: 3.8 g/dL (ref 3.5–5.0)
Alkaline Phosphatase: 56 U/L (ref 38–126)
Anion gap: 12 (ref 5–15)
BUN: 17 mg/dL (ref 8–23)
CO2: 24 mmol/L (ref 22–32)
Calcium: 9.7 mg/dL (ref 8.9–10.3)
Chloride: 106 mmol/L (ref 98–111)
Creatinine, Ser: 1 mg/dL (ref 0.44–1.00)
GFR, Estimated: 60 mL/min — ABNORMAL LOW (ref >60)
Glucose, Bld: 203 mg/dL — ABNORMAL HIGH (ref 70–99)
Potassium: 3.9 mmol/L (ref 3.5–5.1)
Sodium: 142 mmol/L (ref 135–145)
Total Bilirubin: 0.8 mg/dL (ref 0.0–1.2)
Total Protein: 6 g/dL — ABNORMAL LOW (ref 6.5–8.1)

## 2023-11-23 MED ORDER — OXYMETAZOLINE HCL 0.05 % NA SOLN
1.0000 | Freq: Once | NASAL | Status: AC
  Administered 2023-11-23: 1 via NASAL
  Filled 2023-11-23: qty 30

## 2023-11-23 MED ORDER — SALINE SPRAY 0.65 % NA SOLN
1.0000 | Freq: Once | NASAL | Status: AC
  Administered 2023-11-23: 1 via NASAL
  Filled 2023-11-23: qty 44

## 2023-11-23 MED ORDER — SODIUM CHLORIDE 0.9 % IV BOLUS
1000.0000 mL | Freq: Once | INTRAVENOUS | Status: AC
  Administered 2023-11-23: 1000 mL via INTRAVENOUS

## 2023-11-23 NOTE — Progress Notes (Signed)
 Elizabeth [name] is a 73 y.o. female here for    CHIEF COMPLAINT:    Patient Active Problem List  Diagnosis  . Hyperlipidemia  . Menopausal syndrome (hot flushes)  . Allergic rhinitis  . Health care maintenance  . BRCA negative  . Prediabetes     HISTORY OF PRESENT ILLNESS:  Elizabeth [name] complains of nasal dc and headaches and congestion post illness early this month. Saw our pa and viral testing was deferred. No fcs and cough is much better   Past Medical History:  Diagnosis Date  . Allergy   . Glaucoma (increased eye pressure)   . History of cataract   . Hyperlipidemia     Past Surgical History:  Procedure Laterality Date  . COLONOSCOPY  01/12/2012   Dr. EMERSON Mariner @ [facility] - Hyperplastic Polyp, FHPolyps(m), rpt 5 yrs per [facility]  . COLONOSCOPY  11/18/2019   Diverticulosis/Otherwise normal colon/FHx CP/Repeat 51yrs/[facility]  . APPENDECTOMY    . CATARACT EXTRACTION    . CESAREAN SECTION    . laser eye surgery    . TUBAL LIGATION       No fever chills or sweats   No nausea, vomiting or diarrhea  No chest pain, shortness of breath   Social History   Socioeconomic History  . Marital status: Married  Tobacco Use  . Smoking status: Never  . Smokeless tobacco: Never  Vaping Use  . Vaping status: Never Used  Substance and Sexual Activity  . Alcohol use: Never    Comment: rarely  . Drug use: Never  . Sexual activity: Yes    Partners: Male    Birth control/protection: Post-menopausal   Social Drivers of Health   Financial Resource Strain: Low Risk  (04/09/2023)   Overall Financial Resource Strain (CARDIA)   . Difficulty of Paying Living Expenses: Not hard at all  Food Insecurity: No Food Insecurity (04/09/2023)   Hunger Vital Sign   . Worried About Programme researcher, broadcasting/film/video in the Last Year: Never true   . Ran Out of Food in the Last Year: Never true  Transportation Needs: No Transportation Needs (04/09/2023)   PRAPARE - Transportation   . Lack of Transportation  (Medical): No   . Lack of Transportation (Non-Medical): No  Housing Stability: Unknown (10/08/2023)   Housing Stability Vital Sign   . Unable to Pay for Housing in the Last Year: No   . Homeless in the Last Year: No      Current Outpatient Medications:  .  acyclovir (ZOVIRAX) 800 MG tablet, Take 1 tablet (800 mg total) by mouth 3 (three) times daily as needed, Disp: 90 tablet, Rfl: 1 .  carboxymethyl-gly-poly80-PF (REFRESH OPTIVE ADVANCED, PF,) 0.5-1-0.5 % Dpet, Apply 1 % of total volume to eye 2 (two) times daily as needed, Disp: , Rfl:  .  cholecalciferol, vitamin D3, (VITAMIN D3) 10 mcg (400 unit) Cap, , Disp: , Rfl:  .  docosahexaenoic acid/epa (FISH OIL ORAL), Take 2,000 mg by mouth 2 (two) times daily, Disp: , Rfl:  .  estradioL  (ESTRACE ) 0.01 % (0.1 mg/gram) vaginal cream, Place vaginally twice a week, Disp: , Rfl:  .  ipratropium (ATROVENT) 0.06 % nasal spray, Place 2 sprays into both nostrils 3 (three) times daily, Disp: 15 mL, Rfl: 11 .  latanoprost (XALATAN) 0.005 % ophthalmic solution, INSTILL 1 DROP INTO BOTH EYES AT BEDTIME, Disp: 10 mL, Rfl: 3 .  lifitegrast (XIIDRA) 5 % ophthalmic solution, Place 1 drop into both eyes 2 (two) times daily,  Disp: , Rfl:  .  amoxicillin (AMOXIL) 875 MG tablet, Take 1 tablet (875 mg total) by mouth 2 (two) times daily for 7 days, Disp: 14 tablet, Rfl: 0 .  azelastine (ASTELIN) 137 mcg nasal spray, PLACE 2 SPRAYS INTO BOTH NOSTRILS 2 (TWO) TIMES DAILY (Patient not taking: Reported on 11/23/2023), Disp: 90 mL, Rfl: 1 .  predniSONE (DELTASONE) 20 MG tablet, Take 2 tablets (40 mg total) by mouth once daily, Disp: 10 tablet, Rfl: 0  Vitals:   11/23/23 0852  BP: 111/67  Pulse: 60  Temp: 36.4 C (97.6 F)   Body mass index is 18.66 kg/m. No acute distress HEENT: Normocephalic atraumatic. TM's clear. Op clear.  Lungs; clear to ascultation Heart; Regular rate and rhythm  Abdomen; Soft and flat, normal bowel sounds Extremities; No clubbing, cyanosis  or edema  Office Visit on 10/20/2023  Component Date Value Ref Range Status  . RNFL (OD) 10/20/2023 51  m Final  Appointment on 10/01/2023  Component Date Value Ref Range Status  . Hemoglobin A1C 10/01/2023 6.0 (H)  4.2 - 5.6 % Final  . Average Blood Glucose (Calc) 10/01/2023 126  mg/dL Final  . Glucose 93/94/7974 91  70 - 110 mg/dL Final  . Sodium 93/94/7974 145  136 - 145 mmol/L Final  . Potassium 10/01/2023 4.3  3.6 - 5.1 mmol/L Final  . Chloride 10/01/2023 110 (H)  97 - 109 mmol/L Final  . Carbon Dioxide (CO2) 10/01/2023 30.1  22.0 - 32.0 mmol/L Final  . Urea Nitrogen (BUN) 10/01/2023 22  7 - 25 mg/dL Final  . Creatinine 93/94/7974 1.1  0.6 - 1.1 mg/dL Final  . Glomerular Filtration Rate (eGFR) 10/01/2023 53 (L)  >60 mL/min/1.73sq m Final  . Calcium 10/01/2023 9.5  8.7 - 10.3 mg/dL Final  . AST  93/94/7974 17  8 - 39 U/L Final  . ALT  10/01/2023 12  5 - 38 U/L Final  . Alk Phos (alkaline Phosphatase) 10/01/2023 63  34 - 104 U/L Final  . Albumin 10/01/2023 4.2  3.5 - 4.8 g/dL Final  . Bilirubin, Total 10/01/2023 0.6  0.3 - 1.2 mg/dL Final  . Protein, Total 10/01/2023 5.7 (L)  6.1 - 7.9 g/dL Final  . A/G Ratio 93/94/7974 2.8  1.0 - 5.0 gm/dL Final  . Cholesterol, Total 10/01/2023 224 (H)  100 - 200 mg/dL Final  . Triglyceride 93/94/7974 47  35 - 199 mg/dL Final  . HDL (High Density Lipoprotein) Cho* 10/01/2023 93.7 (H)  35.0 - 85.0 mg/dL Final  . LDL Calculated 10/01/2023 878  0 - 130 mg/dL Final  . VLDL Cholesterol 10/01/2023 9  mg/dL Final  . Cholesterol/HDL Ratio 10/01/2023 2.4   Final    ASSESSMENT  AND PLAN:  Diagnoses and all orders for this visit:  Subacute sinusitis, unspecified location  Prediabetes  Familial hypercholesterolemia  Other orders -     predniSONE (DELTASONE) 20 MG tablet; Take 2 tablets (40 mg total) by mouth once daily -     amoxicillin (AMOXIL) 875 MG tablet; Take 1 tablet (875 mg total) by mouth 2 (two) times daily for 7 days   Persistent  sinusitis per zpack etc

## 2023-11-23 NOTE — ED Provider Notes (Signed)
 [facility] Provider Note    Event Date/Time   First MD Initiated Contact with Patient 11/23/23 1341     (approximate)   History   Allergic Reaction   HPI  Elizabeth [name] is a 73 y.o. female with a past medical history of prediabetes, hyperlipidemia who presents after possible drug reaction.  Patient states that she has been on multiple antibiotics this month for sinus pressure and just finished a close with doxycycline and saw her primary care physician today who prescribed prednisone and amoxicillin.  She took both pills and states that tried to minutes after which she had tingling in both of her extremities felt nauseous and lightheaded.  She was advised to go to the emergency department for concern for allergic reaction.  She denies any swelling in her throat difficulty handling her secretions or shortness of breath.  Patient tells me that her blood pressure is always on the low side in the systolic less than 90s.  She feels much improved than when she came into the emergency department.      Physical Exam   Triage Vital Signs: ED Triage Vitals  Encounter Vitals Group     BP 11/23/23 1323 (!) 81/53     Girls Systolic BP Percentile --      Girls Diastolic BP Percentile --      Boys Systolic BP Percentile --      Boys Diastolic BP Percentile --      Pulse Rate 11/23/23 1323 83     Resp 11/23/23 1323 16     Temp 11/23/23 1323 97.6 F (36.4 C)     Temp src --      SpO2 11/23/23 1323 100 %     Weight 11/23/23 1324 107 lb (48.5 kg)     Height 11/23/23 1324 5' 3 (1.6 m)     Head Circumference --      Peak Flow --      Pain Score 11/23/23 1324 6     Pain Loc --      Pain Education --      Exclude from Growth Chart --     Most recent vital signs: Vitals:   11/23/23 1430 11/23/23 1500  BP: (!) 86/51 (!) 97/50  Pulse: 62   Resp: 12   Temp:    SpO2: 100%     Nursing Triage Note reviewed. Vital signs reviewed and patients oxygen saturation is  normoxic  General: Patient is well nourished, well developed, awake and alert, resting comfortably in no acute distress Head: Normocephalic and atraumatic Eyes: Normal inspection, extraocular muscles intact, no conjunctival pallor Ear, nose, throat: Normal external exam No intraoral edema Neck: Normal range of motion Respiratory: Patient is in no respiratory distress, lungs CTAB Cardiovascular: Patient is not tachycardic, RRR without murmur appreciated GI: Abd SNT with no guarding or rebound  Back: Normal inspection of the back with good strength and range of motion throughout all ext Extremities: pulses intact with good cap refills, no LE pitting edema or calf tenderness Neuro: The patient is alert and oriented to person, place, and time, appropriately conversive, with 5/5 bilat UE/LE strength, no gross motor or sensory defects noted. Coordination appears to be adequate. Skin: Warm, dry, and intact Psych: normal mood and affect, no SI or HI  ED Results / Procedures / Treatments   Labs (all labs ordered are listed, but only abnormal results are displayed) Labs Reviewed  COMPREHENSIVE METABOLIC PANEL WITH GFR - Abnormal; Notable  for the following components:      Result Value   Glucose, Bld 203 (*)    Total Protein 6.0 (*)    GFR, Estimated 60 (*)    All other components within normal limits  CBC WITH DIFFERENTIAL/PLATELET     EKG EKG and rhythm strip are interpreted by myself:   EKG: [Normal sinus rhythm] at heart rate of 78, normal QRS duration, QTc 460, nonspecific ST segments and T waves no ectopy EKG not consistent with Acute STEMI Rhythm strip: NSR in lead II   RADIOLOGY None    PROCEDURES:  Critical Care performed: No  Procedures   MEDICATIONS ORDERED IN ED: Medications  sodium chloride  0.9 % bolus 1,000 mL (0 mLs Intravenous Stopped 11/23/23 1503)  oxymetazoline  (AFRIN) 0.05 % nasal spray 1 spray (1 spray Each Nare Given 11/23/23 1410)  sodium chloride   (OCEAN) 0.65 % nasal spray 1 spray (1 spray Each Nare Given 11/23/23 1410)     IMPRESSION / MDM / ASSESSMENT AND PLAN / ED COURSE                                Differential diagnosis includes, but is not limited to, drug reaction, anemia, electrolyte derangement arrhythmia  ED course: Patient presents and she is well-appearing and asymptomatic at the time of my assessment.  I do not think her presentation is consistent at all with anaphylaxis.  EKG demonstrated no arrhythmia.  She was not anemic had no leukocytosis and no profound electrolyte derangements except for mildly elevated glucose.  Her sinus pressure was relieved with a dose of Afrin and nasal saline.  She was counseled on most to use the Afrin twice daily for 3 days and to use the nasal saline throughout the season.  I have provided her the number to an ENT specialist and requested that she call.  She would discontinue the amoxicillin.  Patient was observed for greater than 4 hours past ingestion and requested discharge   Clinical Course as of 11/23/23 1508  Mon Nov 23, 2023  1454 Patient reassessed and feels much improved.  States that her blood pressure was typically this low.  Is not lightheaded with ambulation.  She feels comfortable with discharge I will provide recommendations for an ENT to follow-up with [HD]    Clinical Course User Index [HD] Nicholaus Rolland BRAVO, MD   At time of discharge there is no evidence of acute life, limb, vision, or fertility threat. Patient has stable vital signs, pain is well controlled, patient is ambulatory and p.o. tolerant.  Discharge instructions were completed using the [employer] system. I would refer you to those at this time. All warnings prescriptions follow-up etc. were discussed in detail with the patient. Patient indicates understanding and is agreeable with this plan. All questions answered.  Patient is made aware that they may return to the emergency department for any worsening or new  condition or for any other emergency.  Suggested E/M Coding Level: 4, 99284  This level has been selected based on the 2021/12/10 CPT guidelines for E/M codes in the Emergency Department based on 2/3 of the CoPA, Data, and Risk.  COPA: The patient has an exacerbation, progression, or side effect of treatment of the following illness/illnesses: drug reaction Data(1/3 categories were performed): I reviewed or ordered at least three unique tests, external notes reviewed, and/or the history required an independent historian as one of the three  FINAL CLINICAL IMPRESSION(S) / ED DIAGNOSES   Final diagnoses:  Allergic reaction, initial encounter  Sinus pressure     Rx / DC Orders   ED Discharge Orders     None        Note:  This document was prepared using Dragon voice recognition software and may include unintentional dictation errors.   Nicholaus Rolland BRAVO, MD 11/23/23 [phone]

## 2023-11-23 NOTE — Discharge Instructions (Addendum)
 You were seen in the emergency department secondary to a possible allergic reaction to Augmentin.  Please discontinue this medication.  For the next 3 days use your Afrin morning and night and wait 30 seconds before following with nasal saline.  Please follow-up with an ENT specialist.  Call your primary care physician.  Return if any acutely worsening symptoms or any other emergency -- RETURN PRECAUTIONS & AFTERCARE: (ENGLISH) RETURN PRECAUTIONS: Return immediately to the emergency department or see/call your doctor if you feel worse, weak or have changes in speech or vision, are short of breath, have fever, vomiting, pain, bleeding or dark stool, trouble urinating or any new issues. Return here or see/call your doctor if not improving as expected for your suspected condition. FOLLOW-UP CARE: Call your doctor and/or any doctors we referred you to for more advice and to make an appointment. Do this today, tomorrow or after the weekend. Some doctors only take PPO insurance so if you have HMO insurance you may want to contact your HMO or your regular doctor for referral to a specialist within your plan. Either way tell the doctor's office that it was a referral from the emergency department so you get the soonest possible appointment.  YOUR TEST RESULTS: Take result reports of any blood or urine tests, imaging tests and EKG's to your doctor and any referral doctor. Have any abnormal tests repeated. Your doctor or a referral doctor can let you know when this should be done. Also make sure your doctor contacts this hospital to get any test results that are not currently available such as cultures or special tests for infection and final imaging reports, which are often not available at the time you leave the ER but which may list additional important findings that are not documented on the preliminary report. BLOOD PRESSURE: If your blood pressure was greater than 120/80 have your blood pressure rechecked within 1  to 2 weeks. MEDICATION SIDE EFFECTS: Do not drive, walk, bike, take the bus, etc. if you have received or are being prescribed any sedating medications such as those for pain or anxiety or certain antihistamines like Benadryl. If you have been give one of these here get a taxi home or have a friend drive you home. Ask your pharmacist to counsel you on potential side effects of any new medication

## 2023-11-23 NOTE — ED Triage Notes (Signed)
 Pt comes in via pov with complaints of vomiting and headache that started this morning. Pt states that she started taking amoxicillin and prednisone today for congestion. Pt then noticed her hands and feet started itching, and then she began vomiting. Pt with swelling to both hands, and complains of itching all over her body at this time. Pt hypotensive in triage.
